# Patient Record
Sex: Female | Born: 1995 | Hispanic: Yes | Marital: Single | Smoking: Former smoker
Health system: Southern US, Community
[De-identification: ages and names within clinical notes are randomized; demographics above are authoritative.]

## PROBLEM LIST (undated history)

## (undated) DIAGNOSIS — I491 Atrial premature depolarization: Secondary | ICD-10-CM

## (undated) DIAGNOSIS — K219 Gastro-esophageal reflux disease without esophagitis: Secondary | ICD-10-CM

## (undated) DIAGNOSIS — R519 Headache, unspecified: Secondary | ICD-10-CM

## (undated) DIAGNOSIS — R002 Palpitations: Secondary | ICD-10-CM

## (undated) DIAGNOSIS — D649 Anemia, unspecified: Secondary | ICD-10-CM

## (undated) DIAGNOSIS — F419 Anxiety disorder, unspecified: Secondary | ICD-10-CM

## (undated) DIAGNOSIS — R079 Chest pain, unspecified: Secondary | ICD-10-CM

## (undated) DIAGNOSIS — I071 Rheumatic tricuspid insufficiency: Secondary | ICD-10-CM

## (undated) DIAGNOSIS — R51 Headache: Secondary | ICD-10-CM

## (undated) DIAGNOSIS — Z9109 Other allergy status, other than to drugs and biological substances: Secondary | ICD-10-CM

## (undated) HISTORY — PX: WISDOM TOOTH EXTRACTION: SHX21

---

## 2016-11-07 NOTE — Discharge Instructions (Signed)
General Anesthesia, Adult, Care After These instructions provide you with information about caring for yourself after your procedure. Your health care provider may also give you more specific instructions. Your treatment has been planned according to current medical practices, but problems sometimes occur. Call your health care provider if you have any problems or questions after your procedure. What can I expect after the procedure? After the procedure, it is common to have:  Vomiting.  A sore throat.  Mental slowness.  It is common to feel:  Nauseous.  Cold or shivery.  Sleepy.  Tired.  Sore or achy, even in parts of your body where you did not have surgery.  Follow these instructions at home: For at least 24 hours after the procedure:  Do not: ? Participate in activities where you could fall or become injured. ? Drive. ? Use heavy machinery. ? Drink alcohol. ? Take sleeping pills or medicines that cause drowsiness. ? Make important decisions or sign legal documents. ? Take care of children on your own.  Rest. Eating and drinking  If you vomit, drink water, juice, or soup when you can drink without vomiting.  Drink enough fluid to keep your urine clear or pale yellow.  Make sure you have little or no nausea before eating solid foods.  Follow the diet recommended by your health care provider. General instructions  Have a responsible adult stay with you until you are awake and alert.  Return to your normal activities as told by your health care provider. Ask your health care provider what activities are safe for you.  Take over-the-counter and prescription medicines only as told by your health care provider.  If you smoke, do not smoke without supervision.  Keep all follow-up visits as told by your health care provider. This is important. Contact a health care provider if:  You continue to have nausea or vomiting at home, and medicines are not helpful.  You  cannot drink fluids or start eating again.  You cannot urinate after 8-12 hours.  You develop a skin rash.  You have fever.  You have increasing redness at the site of your procedure. Get help right away if:  You have difficulty breathing.  You have chest pain.  You have unexpected bleeding.  You feel that you are having a life-threatening or urgent problem. This information is not intended to replace advice given to you by your health care provider. Make sure you discuss any questions you have with your health care provider. Document Released: 09/01/2000 Document Revised: 10/29/2015 Document Reviewed: 05/10/2015 Elsevier Interactive Patient Education  2018 Elsevier Inc[employer]     T & A INSTRUCTION SHEET - [facility]  Bud Face, MD Cammy Copa, MD  P. SCOTT BENNETT Linus Salmons, MD  [address] [place], [place] [address] TEL. [phone] [address] [place] [phone]  INFORMATION SHEET FOR A TONSILLECTOMY AND ADENDOIDECTOMY  About Your Tonsils and Adenoids  The tonsils and adenoids are normal body tissues that are part of our immune system.  They normally help to protect [place] against diseases that may enter our mouth and nose.  However, sometimes the tonsils and/or adenoids become too large and obstruct our breathing, especially at night.    If either of these things happen it helps to remove the tonsils and adenoids in order to become healthier. The operation to remove the tonsils and adenoids is called a tonsillectomy and adenoidectomy.  The Location of Your Tonsils and Adenoids  The tonsils are located in the back of the throat on both side and sit in a cradle of muscles. The adenoids are located in the roof of the mouth, behind the nose, and closely associated with the opening of the Eustachian tube to the ear.  Surgery on Tonsils and Adenoids  A tonsillectomy and  adenoidectomy is a short operation which takes about thirty minutes.  This includes being put to sleep and being awakened.  Tonsillectomies and adenoidectomies are performed at [facility] and may require observation period in the recovery room prior to going home.  Following the Operation for a Tonsillectomy  A cautery machine is used to control bleeding.  Bleeding from a tonsillectomy and adenoidectomy is minimal and postoperatively the risk of bleeding is approximately four percent, although this rarely life threatening.    After your tonsillectomy and adenoidectomy post-op care at home:  1. Our patients are able to go home the same day.  You may be given prescriptions for pain medications and antibiotics, if indicated. 2. It is extremely important to remember that fluid intake is of utmost importance after a tonsillectomy.  The amount that you drink must be maintained in the postoperative period.  A good indication of whether a child is getting enough fluid is whether his/her urine output is constant.  As long as children are urinating or wetting their diaper every 6 - 8 hours this is usually enough fluid intake.   3. Although rare, this is a risk of some bleeding in the first ten days after surgery.  This is usually occurs between day five and nine postoperatively.  This risk of bleeding is approximately four percent.  If you or your child should have any bleeding you should remain calm and notify our office or go directly to the Emergency Room at [facility] where they will contact us. Our doctors are available seven days a week for notification.  We recommend sitting up quietly in a chair, place an ice pack on the front of the neck and spitting out the blood gently until we are able to contact you.  Adults should gargle gently with ice water and this may help stop the bleeding.  If the bleeding does not stop after a short time, i.e. 10 to 15 minutes, or seems to be  increasing again, please contact us or go to the hospital.   4. It is common for the pain to be worse at 5 - 7 days postoperatively.  This occurs because the scab is peeling off and the mucous membrane (skin of the throat) is growing back where the tonsils were.   5. It is common for a low-grade fever, less than 102, during the first week after a tonsillectomy and adenoidectomy.  It is usually due to not drinking enough liquids, and we suggest your use liquid Tylenol or the pain medicine with Tylenol prescribed in order to keep your temperature below 102.  Please follow the directions on the back of the bottle. 6. Do not take aspirin or any products that contain aspirin such as Bufferin, Anacin, Ecotrin, aspirin gum, Goodies, BC headache powders, etc., after a T&A because it can promote bleeding.  Please check with our office before administering any other medication that may been prescribed by other doctors during the two week post-operative period. 7. If you happen to look in the mirror or into your childs mouth you will see white/gray patches on  the back of the throat.  This is what a scab looks like in the mouth and is normal after having a T&A.  It will disappear once the tonsil area heals completely. However, it may cause a noticeable odor, and this too will disappear with time.     8. You or your child may experience ear pain after having a T&A.  This is called referred pain and comes from the throat, but it is felt in the ears.  Ear pain is quite common and expected.  It will usually go away after ten days.  There is usually nothing wrong with the ears, and it is primarily due to the healing area stimulating the nerve to the ear that runs along the side of the throat.  Use either the prescribed pain medicine or Tylenol as needed.  9. The throat tissues after a tonsillectomy are obviously sensitive.  Smoking around children who have had a tonsillectomy significantly increases the risk of bleeding.   DO NOT SMOKE!

## 2016-11-11 ENCOUNTER — Ambulatory Visit: Payer: BLUE CROSS/BLUE SHIELD | Admitting: Anesthesiology

## 2016-11-11 ENCOUNTER — Ambulatory Visit
Admission: RE | Admit: 2016-11-11 | Discharge: 2016-11-11 | Disposition: A | Discharge status: Home / Self Care | Payer: BLUE CROSS/BLUE SHIELD | Source: Ambulatory Visit | Attending: Otolaryngology | Admitting: Otolaryngology

## 2016-11-11 ENCOUNTER — Encounter
Admission: RE | Disposition: A | Discharge status: Home / Self Care | Payer: Self-pay | Source: Ambulatory Visit | Attending: Otolaryngology

## 2016-11-11 DIAGNOSIS — K219 Gastro-esophageal reflux disease without esophagitis: Secondary | ICD-10-CM | POA: Diagnosis not present

## 2016-11-11 DIAGNOSIS — J3501 Chronic tonsillitis: Secondary | ICD-10-CM | POA: Diagnosis present

## 2016-11-11 DIAGNOSIS — Z87891 Personal history of nicotine dependence: Secondary | ICD-10-CM | POA: Insufficient documentation

## 2016-11-11 DIAGNOSIS — F419 Anxiety disorder, unspecified: Secondary | ICD-10-CM | POA: Insufficient documentation

## 2016-11-11 HISTORY — DX: Headache: R51

## 2016-11-11 HISTORY — PX: TONSILLECTOMY: SHX5217

## 2016-11-11 HISTORY — DX: Palpitations: R00.2

## 2016-11-11 HISTORY — DX: Headache, unspecified: R51.9

## 2016-11-11 HISTORY — DX: Gastro-esophageal reflux disease without esophagitis: K21.9

## 2016-11-11 HISTORY — DX: Other allergy status, other than to drugs and biological substances: Z91.09

## 2016-11-11 HISTORY — DX: Chest pain, unspecified: R07.9

## 2016-11-11 SURGERY — TONSILLECTOMY
Anesthesia: General | Site: Throat | Wound class: Clean Contaminated

## 2016-11-11 MED ORDER — IBUPROFEN 100 MG/5ML PO SUSP: 600.0000 mg | Freq: Once | ORAL | Status: DC | PRN

## 2016-11-11 MED ORDER — HYDROCODONE-ACETAMINOPHEN 7.5-325 MG/15ML PO SOLN: ORAL | 0 refills | Status: DC

## 2016-11-11 MED ORDER — SUCCINYLCHOLINE CHLORIDE 20 MG/ML IJ SOLN
INTRAMUSCULAR | Status: DC | PRN
  Administered 2016-11-11: 80 mg via INTRAVENOUS

## 2016-11-11 MED ORDER — ONDANSETRON HCL 4 MG/2ML IJ SOLN
4.0000 mg | Freq: Once | INTRAMUSCULAR | Status: AC | PRN
  Administered 2016-11-11: 4 mg via INTRAVENOUS

## 2016-11-11 MED ORDER — OXYCODONE HCL 5 MG/5ML PO SOLN
5.0000 mg | Freq: Once | ORAL | Status: AC | PRN
  Administered 2016-11-11: 5 mg via ORAL

## 2016-11-11 MED ORDER — MIDAZOLAM HCL 5 MG/5ML IJ SOLN
INTRAMUSCULAR | Status: DC | PRN
  Administered 2016-11-11: 2 mg via INTRAVENOUS

## 2016-11-11 MED ORDER — DEXAMETHASONE SODIUM PHOSPHATE 4 MG/ML IJ SOLN
INTRAMUSCULAR | Status: DC | PRN
  Administered 2016-11-11: 10 mg via INTRAVENOUS

## 2016-11-11 MED ORDER — AMOXICILLIN 400 MG/5ML PO SUSR: ORAL | 0 refills | Status: DC

## 2016-11-11 MED ORDER — OXYCODONE HCL 5 MG PO TABS: 5.0000 mg | ORAL_TABLET | Freq: Once | ORAL | Status: AC | PRN

## 2016-11-11 MED ORDER — PROPOFOL 10 MG/ML IV BOLUS
INTRAVENOUS | Status: DC | PRN
Start: 2016-11-11 — End: 2016-11-11
  Administered 2016-11-11: 160 mg via INTRAVENOUS

## 2016-11-11 MED ORDER — ACETAMINOPHEN 10 MG/ML IV SOLN
1000.0000 mg | Freq: Once | INTRAVENOUS | Status: AC
  Administered 2016-11-11: 1000 mg via INTRAVENOUS

## 2016-11-11 MED ORDER — LIDOCAINE HCL (CARDIAC) 20 MG/ML IV SOLN
INTRAVENOUS | Status: DC | PRN
  Administered 2016-11-11: 50 mg via INTRAVENOUS

## 2016-11-11 MED ORDER — BUPIVACAINE HCL (PF) 0.25 % IJ SOLN
INTRAMUSCULAR | Status: DC | PRN
  Administered 2016-11-11: 4 mL

## 2016-11-11 MED ORDER — PREDNISOLONE SODIUM PHOSPHATE 15 MG/5ML PO SOLN: ORAL | 0 refills | Status: DC

## 2016-11-11 MED ORDER — FENTANYL CITRATE (PF) 100 MCG/2ML IJ SOLN
25.0000 ug | INTRAMUSCULAR | Status: DC | PRN
  Administered 2016-11-11 (×3): 25 ug via INTRAVENOUS
  Administered 2016-11-11: 100 ug via INTRAVENOUS

## 2016-11-11 MED ORDER — LACTATED RINGERS IV SOLN
INTRAVENOUS | Status: DC
  Administered 2016-11-11: 08:00:00 via INTRAVENOUS

## 2016-11-11 MED ORDER — GLYCOPYRROLATE 0.2 MG/ML IJ SOLN
INTRAMUSCULAR | Status: DC | PRN
  Administered 2016-11-11: .1 mg via INTRAVENOUS

## 2016-11-11 SURGICAL SUPPLY — 16 items
CANISTER SUCT 1200ML W/VALVE (MISCELLANEOUS) ×3 IMPLANT
CATH ROBINSON RED A/P 10FR (CATHETERS) ×3 IMPLANT
COAG SUCT 10F 3.5MM HAND CTRL (MISCELLANEOUS) ×3 IMPLANT
DECANTER SPIKE VIAL GLASS SM (MISCELLANEOUS) ×3 IMPLANT
ELECT CAUTERY BLADE TIP 2.5 (TIP) ×3
ELECTRODE CAUTERY BLDE TIP 2.5 (TIP) ×1 IMPLANT
GLOVE BIO SURGEON STRL SZ7.5 (GLOVE) ×3 IMPLANT
KIT ROOM TURNOVER OR (KITS) ×3 IMPLANT
NEEDLE HYPO 25GX1X1/2 BEV (NEEDLE) ×3 IMPLANT
NS IRRIG 500ML POUR BTL (IV SOLUTION) ×3 IMPLANT
PACK TONSIL/ADENOIDS (PACKS) ×3 IMPLANT
PAD GROUND ADULT SPLIT (MISCELLANEOUS) ×3 IMPLANT
PENCIL ELECTRO HAND CTR (MISCELLANEOUS) ×3 IMPLANT
SOL ANTI-FOG 6CC FOG-OUT (MISCELLANEOUS) ×1 IMPLANT
SOL FOG-OUT ANTI-FOG 6CC (MISCELLANEOUS) ×2
SYRINGE 10CC LL (SYRINGE) ×3 IMPLANT

## 2016-11-11 NOTE — Op Note (Signed)
11/11/2016  8:12 AM    Nolene EbbsJessica Volk  [id number]   Pre-Op Diagnosis:  chronic tonsillitis  Post-op Diagnosis: chronic tonsillitis  Procedure: Tonsillectomy  Surgeon:  Sandi MealyBennett, [place] Hudman S., MD  Anesthesia:  General endotracheal  EBL:  Less than 25 cc  Complications:  None  Findings: 3+ cryptic tonsils  Procedure: The patient was taken to the Operating Room and placed in the supine position.  After induction of general endotracheal anesthesia, the table was turned 90 degrees and the patient was draped in the usual fashion  with the eyes protected.  A mouth gag was inserted into the oral cavity to open the mouth, and examination of the oropharynx showed the uvula was non-bifid. The palate was palpated, and there was no evidence of submucous cleft. Examination of the nasopharynx showed no obstructing adenoids. The right tonsil was grasped with an Allis clamp and resected from the tonsillar fossa in the usual fashion with the Bovie. The left tonsil was resected in the same fashion. The Bovie was used to obtain hemostasis. Each tonsillar fossa was then carefully injected with 0.25% marcaine, avoiding intravascular injection. The nose and throat were irrigated and suctioned to remove any  blood clot. The mouth gag was  removed with no evidence of active bleeding.  The patient was then returned to the anesthesiologist for awakening, and was taken to the Recovery Room in stable condition.  Cultures:  None.  Specimens:  Tonsils.  Disposition:   PACU to home  Plan: Soft, bland diet and push fluids. Take pain medications and antibiotics as prescribed. No strenuous activity for 2 weeks. Follow-up in 3 weeks.  Sandi MealyBennett, Kealan Buchan S 11/11/2016 8:12 AM

## 2016-11-11 NOTE — Anesthesia Postprocedure Evaluation (Signed)
Anesthesia Post Note  Patient: Tracie Harvey  Procedure(s) Performed: Procedure(s) (LRB): TONSILLECTOMY (N/A)  Patient location during evaluation: PACU Anesthesia Type: General Level of consciousness: awake and alert Pain management: pain level controlled Vital Signs Assessment: post-procedure vital signs reviewed and stable Respiratory status: spontaneous breathing, nonlabored ventilation, respiratory function stable and patient connected to nasal cannula oxygen Cardiovascular status: blood pressure returned to baseline and stable Postop Assessment: no signs of nausea or vomiting Anesthetic complications: no    Scarlette Sliceachel B Kalup Jaquith

## 2016-11-11 NOTE — H&P (Signed)
History and physical reviewed and will be scanned in later. No change in medical status reported by the patient or family, appears stable for surgery. All questions regarding the procedure answered, and patient (or family if a child) expressed understanding of the procedure.  Tracie Harvey S @TODAY@ 

## 2016-11-11 NOTE — Transfer of Care (Signed)
Immediate Anesthesia Transfer of Care Note  Patient: Tracie Harvey  Procedure(s) Performed: Procedure(s): TONSILLECTOMY (N/A)  Patient Location: PACU  Anesthesia Type: General  Level of Consciousness: awake, alert  and patient cooperative  Airway and Oxygen Therapy: Patient Spontanous Breathing and Patient connected to supplemental oxygen  Post-op Assessment: Post-op Vital signs reviewed, Patient's Cardiovascular Status Stable, Respiratory Function Stable, Patent Airway and No signs of Nausea or vomiting  Post-op Vital Signs: Reviewed and stable  Complications: No apparent anesthesia complications

## 2016-11-11 NOTE — Anesthesia Procedure Notes (Signed)
Procedure Name: Intubation Date/Time: 11/11/2016 7:48 AM Performed by: Tracie PicketAMYOT, Tracie Harvey Pre-anesthesia Checklist: Patient identified, Emergency Drugs available, Suction available, Patient being monitored and Timeout performed Patient Re-evaluated:Patient Re-evaluated prior to inductionOxygen Delivery Method: Circle system utilized Preoxygenation: Pre-oxygenation with 100% oxygen Intubation Type: IV induction Ventilation: Mask ventilation without difficulty Laryngoscope Size: Miller and 2 Grade View: Grade I Tube type: Oral Rae Tube size: 7.0 mm Number of attempts: 1 Placement Confirmation: ETT inserted through vocal cords under direct vision,  positive ETCO2 and breath sounds checked- equal and bilateral Tube secured with: Tape Dental Injury: Teeth and Oropharynx as per pre-operative assessment

## 2016-11-11 NOTE — Anesthesia Preprocedure Evaluation (Signed)
Anesthesia Evaluation  Patient identified by MRN, date of birth, ID band Patient awake    Reviewed: Allergy & Precautions, H&P , NPO status , Patient's Chart, lab work & pertinent test results, reviewed documented beta blocker date and time   Airway Mallampati: II  TM Distance: >3 FB Neck ROM: full    Dental no notable dental hx.    Pulmonary former smoker,    Pulmonary exam normal breath sounds clear to auscultation       Cardiovascular Exercise Tolerance: Good negative cardio ROS   Rhythm:regular Rate:Normal     Neuro/Psych  Headaches, PSYCHIATRIC DISORDERS (anxiety)    GI/Hepatic Neg liver ROS, GERD  ,  Endo/Other  negative endocrine ROS  Renal/GU negative Renal ROS  negative genitourinary   Musculoskeletal   Abdominal   Peds  Hematology negative hematology ROS (+)   Anesthesia Other Findings   Reproductive/Obstetrics negative OB ROS                             Anesthesia Physical Anesthesia Plan  ASA: II  Anesthesia Plan: General   Post-op Pain Management:    Induction:   PONV Risk Score and Plan:   Airway Management Planned:   Additional Equipment:   Intra-op Plan:   Post-operative Plan:   Informed Consent: I have reviewed the patients History and Physical, chart, labs and discussed the procedure including the risks, benefits and alternatives for the proposed anesthesia with the patient or authorized representative who has indicated his/her understanding and acceptance.   Dental Advisory Given  Plan Discussed with: CRNA  Anesthesia Plan Comments:         Anesthesia Quick Evaluation

## 2016-11-12 ENCOUNTER — Encounter: Payer: Self-pay | Admitting: Otolaryngology

## 2016-11-13 LAB — SURGICAL PATHOLOGY

## 2017-03-18 ENCOUNTER — Encounter: Payer: BLUE CROSS/BLUE SHIELD | Attending: Student | Admitting: Dietician

## 2017-03-18 ENCOUNTER — Encounter: Payer: Self-pay | Admitting: Dietician

## 2017-03-18 VITALS — Ht 63.0 in | Wt 168.5 lb

## 2017-03-18 DIAGNOSIS — R1084 Generalized abdominal pain: Secondary | ICD-10-CM | POA: Diagnosis not present

## 2017-03-18 NOTE — Patient Instructions (Addendum)
   Food to avoid to decrease stomach acid: Spicy foods, high fat foods, fried foods, acidic foods, coffee/ black tea, chocolate, processed meats  Eat foods high in calcium and fiber, and foods that contain probiotics (yogurt) to aid with stomach pains  Aim for 1700kcal / day for healthy weight loss. Would like to be able to get you to 1850kcal and still see weight loss... But we'll assess and go from there!   Remember, think color and variety when building meals  Continue to drink less sodas and instead choosing water / water + Miyo. Great job!  Work on Engineer, materials when eating out. For example: choosing chicken or shrimp tacos instead of steak, ordering veggies as a side, choosing grilled/baked meat instead of fried, asking for less cheese, etc.

## 2017-03-18 NOTE — Progress Notes (Signed)
Medical Nutrition Therapy: Visit start time: 1030  end time: 1130  Assessment:  Diagnosis: abdominal pain Past medical history: depression, eating disorder, anxiety, anemia, dysmenorrhea, see chart Psychosocial issues/ stress concerns: depression Preferred learning method:  . Auditory . Visual . Hands-on  Current weight: 168.5lb  Height:  Medications, supplements: Vitamin B12, Prenatal vitamin, Pantoprazole sodium, Dicyclomine, Metrogel, Fluconazole, see chart  Progress and evaluation: Patient reports wt gain and stomach pains both of which are of concern to her. Expresses needing assistance with how to plan and structure meals. Per her, testing revealed mild gastritis and a polyp and she is working with her doctor to find a medication that relieves her pain. States she struggles with her diet but likes to exercise. Describes a h/o an eating disorder which involved over-exercising and under-eating. Lowest wt 115#. Current goal wt 120-125#. Believes to be an emotional eater, as food is tied tightly to her culture. C/o not being able to stick to a healthier eating plan. Currently tracking calories x1 month in the MyFitnessPal app and has calories set at 1200.   Physical activity: Strength training or circuit-style training 3-5 days/wk for 30-33min each. Works out fasted   Dietary Intake:  Usual eating pattern includes 3 meals and 2 snacks per day. Dining out frequency: 6 meals per week.  Breakfast: eggs + veggies or sausage + fruit, cereal, oatmeal Snack: nuts and cheese Lunch: varies/ eats out the most. Sandwich with deli meat, [employer] (pork or steak tacos in a soft shell), [employer] chicken wings Snack: n/a Supper: boyfriend's mom cooks pasta, burgers & steaks + vegetables Snack: tomatoes and cucumbers with Tanjen seasoning Beverages: has cut down on sodas/ energy drinks, now drinks hot tea more than coffee, almond milk, water + Miyo.  Nutrition  Care Education: Topics covered: dietary interventions for reducing stomach pain/ acid, eating to fuel daily activity + exercise, how to make healthier choice when eating out, emotional eating, cravings and eating adequate calories, calculation of recommended daily calorie for healthy weight loss (1850kcal). Basic nutrition: basic food groups, appropriate nutrient balance, appropriate meal and snack schedule, general nutrition guidelines    Weight control: benefits of weight control, identifying healthy weight, determining reasonable weight goal, behavioral changes for weight loss Advanced nutrition: dining out, fiber/ probiotics and GI function Other lifestyle changes:  benefits of making changes, increasing motivation, readiness for change, identifying habits that need to change  Nutritional Diagnosis:  NI-1.5 Excessive energy intake As related to dietary habits.  As evidenced by BMI 30, patient report of being an emotional eater, h/o eating disorder (binge eating).  Intervention: Discussion as noted above. Patient was provided agreed upon goals, choosing 1-2 to work on until next visit on Friday November 9th. Dietary recommendations to help alleviate stomach pains were given. Personalized meal plan will be provided at next visit, based off of weight reduction goal.  Education Materials given: Marland [employer] Quick and Healthy Meals . Goals/ instructions  Learner/ who was taught:  . Patient   Level of understanding: Marland Kitchen Verbalizes/ demonstrates competency  Demonstrated degree of understanding via:   Teach back Learning barriers: . None  Willingness to learn/ readiness for change: . Eager, change in progress  Monitoring and Evaluation:  Dietary intake, exercise, stomach pains, and body weight      follow up: in 4 week(s)

## 2017-04-17 ENCOUNTER — Encounter: Payer: BLUE CROSS/BLUE SHIELD | Attending: Student | Admitting: Dietician

## 2017-04-17 ENCOUNTER — Encounter: Payer: Self-pay | Admitting: Dietician

## 2017-04-17 VITALS — Wt 169.0 lb

## 2017-04-17 DIAGNOSIS — R1084 Generalized abdominal pain: Secondary | ICD-10-CM | POA: Diagnosis not present

## 2017-04-17 NOTE — Progress Notes (Signed)
Medical Nutrition Therapy: Visit start time: 1000  end time: 1100  Assessment:  Diagnosis: abdominal pain Medical history changes: none Psychosocial issues/ stress concerns: none  Current weight: 169lb  Height: 5\' 3"  Medications, supplement changes: none  Progress and evaluation: Patient reports to have increased her calorie intake from 1200kcal/day to 1800kcal/day immediately following our first appointment. Continues to use MyFitnessPal app to track calorie intake. Wt has remained essentially the same; gain of 0.5# x4 wks. With increase in caloric intake she reports some days with increased energy and feeling more satisfied throughout the day. She also reports having more flexibility when eating out which she enjoys. However some days she feels that she must force-feed herself to meet her 1800kcal goal. Other interventions include eating more frequently: 3 meals and 2-3 snacks consistently. She is eating more vegetables, more protein and less carbohydrates. States she now eats 75% of meals at home and 25% of meals out. When eating out she feels she is making better choices. Has started to go to [employer] for lunch. At dinner time her boyfriend's mom is starting to prepare some healthier options and tries to accommodate to patient's requests, such as leaving toppings on the side. Continues to report having cravings for some carbohydrate-heavy foods.   Physical activity: Strength training or circuit-style training 3-5 days/wk for 30-45 min each  Dietary Intake:  Usual eating pattern includes 3 meals and 2-3 snacks per day. Dining out frequency: 3-4 meals per week.  Nutrition Care Education: Topics covered: *Fast FoodsWeight management guidelinesgeneral**, role of carbohydrates in the body, learning to be flexible with food choices, listening to the body's hunger and fullness cues, emotional eating Basic nutrition: basic food groups, appropriate nutrient balance, appropriate meal and snack  schedule, general nutrition guidelines  Weight control: behavioral changes for weight loss Advanced nutrition: dining out, food label reading Other lifestyle changes:  benefits of making changes, readiness for change, identifying habits that need to change  Nutritional Diagnosis:  [place]-3.3 Overweight/obesity As related to dietary habits.  As evidenced by BMI 29.  Intervention: Discussion as noted above. Patient will use meal planning/portion guidelines provided in order to learn how to plan healthy meals and how to include carbohydrates in the correct portions. She is considering fluctuating between 1700 and 1800kcal/ day depending on hunger and whether or not she does out to eat that day. Follow up in 4wks (May 15, 2017)  Education Materials given:  . 1800kcal / day meal plan recommendations . Goals/ instructions  Learner/ who was taught:  . Patient  Level of understanding: Marland Kitchen. Verbalizes/ demonstrates competency  Demonstrated degree of understanding via:   Teach back Learning barriers: . None  Willingness to learn/ readiness for change: . Eager, change in progress  Monitoring and Evaluation:  Dietary intake, exercise, and body weight      follow up: in 4 week(s) - December 7th

## 2017-05-15 ENCOUNTER — Encounter: Payer: BLUE CROSS/BLUE SHIELD | Attending: Student | Admitting: Dietician

## 2017-05-15 ENCOUNTER — Encounter: Payer: Self-pay | Admitting: Dietician

## 2017-05-15 VITALS — Wt 167.7 lb

## 2017-05-15 DIAGNOSIS — R1084 Generalized abdominal pain: Secondary | ICD-10-CM | POA: Diagnosis not present

## 2017-05-15 NOTE — Patient Instructions (Signed)
   Day 1: upper body day + cardio  Day 2: total body circuit day  Day 3: lower body day  Day 4: total body circuit day  Day 5: total body circuit day + cardio  Options for total body days: push, pull, legs, cardio and rotate through the circuit 3-5 times OR pick two exercising per body part and rotate through that circuit 3-4 times

## 2017-05-15 NOTE — Progress Notes (Signed)
Medical Nutrition Therapy: Visit start time: 1000  end time: 1030  Assessment:  Diagnosis: abdominal pain Medical history changes: none Psychosocial issues/ stress concerns: patient reports increased stress, trouble sleeping and seasonal depression  Current weight: 167.7lb  Height: 5\' 3"  Medications, supplement changes: none  Progress and evaluation: Weight loss of 2# since last visit. Patient reports scale wt to be 165# this morning. Continues to use MyFitnessPal app to track daily calories. Consuming between 1700-1750kcal/day typically with an untracked meal or two if going out to eat, particularly at her favorite [employer]. Feels less hungry and has had less cravings for carbohydrate-dense foods, as she has been honoring cravings when they do arise and being less restrictive with foods. Continues to eat the majority of meals at home and has started to eat more fish. States her mental and physical health have been up and down lately, making working out consistently more of a challenge. Her sleep quality has also decreased. C/o worsening stomach pains despite limiting acidic and spicy foods.   Physical activity: total-body circuit style training 3-5 days / week for 30min - 1hour each session  Dietary Intake:  Usual eating pattern includes 3 meals and 2-3 snacks per day. Dining out frequency: 2-4 meals per week.   Nutrition Care Education: Topics covered: *Food Diary Weight management guidelinesgeneral, benefits and recommended dosage of Vitamin D supplement, Melatonin supplement benefits, physical signs of stress (IE stomach pain) Basic nutrition: basic food groups, appropriate nutrient balance, appropriate meal and snack schedule, general nutrition guidelines    Weight control: behavioral changes for weight loss Advanced nutrition: dining out, hunger/fullness cues, managing and honoring food cravings Other lifestyle changes:  benefits of making changes, increasing motivation,  readiness for change, identifying habits that need to change  Nutritional Diagnosis:  [place]-3.3 Overweight/obesity As related to lifestyle habits and h/o disordered eating tendencies.  As evidenced by BMI 29.71 and patient report of h/o binge eating.  Intervention: Discussion as noted above. Patient will continue to honor her food cravings and to keep daily calories between 1700-1800kcal/day with a couple of "flexible" meals each week depending on hunger level. She will also trial new exercise routine in an attempt to increase her weekly adherence.  Education Materials given:  Marland Kitchen. Goals/ instructions  Learner/ who was taught:  . Patient   Level of understanding: Marland Kitchen. Verbalizes/ demonstrates competency  Demonstrated degree of understanding via:   Teach back Learning barriers: . None  Willingness to learn/ readiness for change: . Eager, change in progress  Monitoring and Evaluation:  Dietary intake, exercise, and body weight      follow up: in 4 week(s): January 4th, 2019.

## 2017-06-12 ENCOUNTER — Encounter: Payer: BLUE CROSS/BLUE SHIELD | Attending: Student | Admitting: Dietician

## 2017-06-12 ENCOUNTER — Encounter: Payer: Self-pay | Admitting: Dietician

## 2017-06-12 VITALS — Wt 174.0 lb

## 2017-06-12 DIAGNOSIS — R1084 Generalized abdominal pain: Secondary | ICD-10-CM

## 2017-06-12 DIAGNOSIS — E6609 Other obesity due to excess calories: Secondary | ICD-10-CM

## 2017-06-12 DIAGNOSIS — Z6830 Body mass index (BMI) 30.0-30.9, adult: Secondary | ICD-10-CM

## 2017-06-12 NOTE — Progress Notes (Signed)
Medical Nutrition Therapy: Visit start time: 1000  end time: 1100  Assessment:  Diagnosis: abdominal pain Medical history changes: none Psychosocial issues/ stress concerns: reports holidays to have been stressful  Current weight: 174.7lb  Height: 5\' 3"  Medications, supplement changes: started taking Vitamin D3 2000IU  Progress and evaluation: Post Holiday season, patient has gained 7# x4 weeks. Reports a change in her eating patterns, having more and different foods available and less control over daily foods consumed. Feels that she had less "self control" and more stress during this time, though did remain consistent with her workouts. She did not track her daily calories during the Holidays but did try to downsize portions of certain foods. Continues to desire goal wt of 120-125#. Reports stomach pains to be inconsistent but still prevalent. Her main concerns going forward are stress eating and what to choose for lunches at work/ planning meals ahead of time.   Physical activity: Total body circuits or upper/ lower body split; tried new workout structure discussed last session. Continues to work out 3-5 days/ week.  Dietary Intake:  Usual eating pattern includes 3 meals and 2-3 snacks per day. Dining out frequency: 2-4 meals per week.   Nutrition Care Education: Topics covered: *Lunch with PunchWeight management guidelinesgeneral, meal prep and easy lunch ideas, intuitive eating vs tracking calories, 80/20 rule of eating, stress eating Basic nutrition: basic food groups, appropriate nutrient balance, appropriate meal and snack schedule, general nutrition guidelines    Weight control: benefits of weight control, determining reasonable weight goal, behavioral changes for weight loss Advanced nutrition: recipe modification, cooking techniques, food label reading Other lifestyle changes:  benefits of making changes, readiness for change, identifying habits that need to change  Nutritional  Diagnosis:  [place]-3.3 Overweight/obesity As related to eating habits.  As evidenced by BMI 30.82. NI-1.7 Predicted excessive energy intake As related to change in foods available and eating habits during the Holiday season.  As evidenced by weight gain of 7# in 4 weeks.   Intervention: Discussion as noted above. 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 She is ready to get back on track towards her healthy lifestyle goals after the Holiday season.   Education Materials given:  . 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 Goals/ instructions  Learner/ who was taught:  . Patient  Level of understanding: Marland Kitchen. Verbalizes/ demonstrates competency  Demonstrated degree of understanding via:   Teach back Learning barriers: . None  Willingness to learn/ readiness for change: . Eager, change in progress  Monitoring and Evaluation:  Dietary intake, exercise, mindset around food, and body weight      follow up: in 5 week(s): February 8th, 2019

## 2017-06-15 ENCOUNTER — Other Ambulatory Visit: Payer: Self-pay | Admitting: Student

## 2017-06-15 DIAGNOSIS — R110 Nausea: Secondary | ICD-10-CM

## 2017-06-15 DIAGNOSIS — N390 Urinary tract infection, site not specified: Secondary | ICD-10-CM

## 2017-06-15 DIAGNOSIS — R1084 Generalized abdominal pain: Secondary | ICD-10-CM

## 2017-06-29 ENCOUNTER — Ambulatory Visit
Admission: RE | Admit: 2017-06-29 | Discharge: 2017-06-29 | Disposition: A | Discharge status: Home / Self Care | Payer: BLUE CROSS/BLUE SHIELD | Source: Ambulatory Visit | Attending: Student | Admitting: Student

## 2017-06-29 DIAGNOSIS — R1084 Generalized abdominal pain: Secondary | ICD-10-CM | POA: Diagnosis present

## 2017-06-29 DIAGNOSIS — N83201 Unspecified ovarian cyst, right side: Secondary | ICD-10-CM | POA: Diagnosis not present

## 2017-06-29 DIAGNOSIS — N390 Urinary tract infection, site not specified: Secondary | ICD-10-CM | POA: Insufficient documentation

## 2017-06-29 DIAGNOSIS — R110 Nausea: Secondary | ICD-10-CM | POA: Diagnosis not present

## 2017-06-29 MED ORDER — IOPAMIDOL (ISOVUE-300) INJECTION 61%
100.0000 mL | Freq: Once | INTRAVENOUS | Status: AC | PRN
  Administered 2017-06-29: 100 mL via INTRAVENOUS

## 2017-07-17 ENCOUNTER — Ambulatory Visit: Payer: BLUE CROSS/BLUE SHIELD | Admitting: Dietician

## 2017-07-31 ENCOUNTER — Ambulatory Visit: Payer: BLUE CROSS/BLUE SHIELD | Admitting: Dietician

## 2017-08-07 ENCOUNTER — Encounter: Payer: BLUE CROSS/BLUE SHIELD | Attending: Student | Admitting: Dietician

## 2017-08-07 ENCOUNTER — Encounter: Payer: Self-pay | Admitting: Dietician

## 2017-08-07 VITALS — Wt 168.6 lb

## 2017-08-07 DIAGNOSIS — R1084 Generalized abdominal pain: Secondary | ICD-10-CM | POA: Diagnosis not present

## 2017-08-07 DIAGNOSIS — Z008 Encounter for other general examination: Secondary | ICD-10-CM | POA: Insufficient documentation

## 2017-08-07 DIAGNOSIS — R194 Change in bowel habit: Secondary | ICD-10-CM | POA: Insufficient documentation

## 2017-08-07 DIAGNOSIS — Z862 Personal history of diseases of the blood and blood-forming organs and certain disorders involving the immune mechanism: Secondary | ICD-10-CM | POA: Insufficient documentation

## 2017-08-07 NOTE — Progress Notes (Signed)
Medical Nutrition Therapy: Visit start time: 0900  end time: 0930  Assessment:  Diagnosis: obesity Medical history changes: n/a Psychosocial issues/ stress concerns: feels stress from balancing school and jobs  Current weight: 168.6#   Height: 5\' 3"  Medications, supplement changes: stopped taking her birth control, started taking a probiotic temporarily d/t increased stomach pain  Progress and evaluation: Patient continues to follow 1700-1750kcal eating plan which she tracks in MyFitnessPal fairly consistently. She feels that she is making weekly meal prepping more of a habit and continues to eat and cook at home more than eating out. Tried making recipes in the slow cooker as suggested in last session with success. Made carnitas this week. She states that she has swapped many of her snack options for more nutrient-dense ones. When she feels stressed, rather than choosing empty calories to snack on such as chips she will now choose a yogurt or dark chocolate. Reports appetite / feelings of hunger and fullness to change daily based off of what foods she has chosen to eat. Describes feeling hungry an hour or two after eating cereal for breakfast, vs being kept full for several hours after eating eggs + fruit + yogurt. Due to worsening stomach pains with unknown cause, she has started to follow a gluten -free diet temporarily to see if her symptoms are alleviated.  Physical activity: Continues to do total-body circuit style training but is working out less days each week d/t back injury and recent illness. Got a FitBit and now aims to walk [id number] steps/day.  Dietary Intake:  Usual eating pattern includes 3 meals and 1-3 snacks per day. Dining out frequency: 1-3 meals per week.   Nutrition Care Education: Topics covered: *Lunch with PunchWeight management guidelinesgeneral, fiber and GI function; aim for 25g/day, meal prepping/ planning, recipe ideas, stress eating and mindful eating, foods that trigger  GERD symptoms, lower calorie/ higher "volume" foods, fiber protein healthy fats and satiety.  Basic nutrition: basic food groups, appropriate nutrient balance, appropriate meal and snack schedule, general nutrition guidelines    Weight control: behavioral changes for weight loss Advanced nutrition:  recipe modification, cooking techniques Other lifestyle changes:  benefits of making changes, identifying habits that need to change   Nutritional Diagnosis:  [place]-1.4 Altered GI function As related to unknown cause.  As evidenced by patient c/o recurrent stomach pain which keeps her awake at night.  Intervention: Discussion as noted above. Patient will try a few different interventions in order to try to alleviate stomach pains until she has an appointment her with gastroenterologist, such as Gluten free diet for approx. 3 weeks (has already been following for 1 week), eating an evening snack (no GERD triggering foods), and monitoring her fiber intake with a goal of 25g/day. She was also advised to increase her daily caloric intake by 50-100kcal should wt loss exceed 2# BW per week.  Education Materials given:  Marland Kitchen. Guidelines for mindful eating flow sheet . Goals/ instructions  Learner/ who was taught:  . Patient   Level of understanding: Marland Kitchen. Verbalizes/ demonstrates competency  Demonstrated degree of understanding via:   Teach back Learning barriers: . None  Willingness to learn/ readiness for change: . Eager, change in progress  Monitoring and Evaluation:  Dietary intake, exercise, and body weight      follow up: in 5 week(s): March 29th

## 2017-09-04 ENCOUNTER — Encounter: Payer: Self-pay | Admitting: Dietician

## 2017-09-04 VITALS — Wt 163.9 lb

## 2017-09-04 DIAGNOSIS — R1084 Generalized abdominal pain: Secondary | ICD-10-CM

## 2017-09-04 NOTE — Progress Notes (Signed)
Medical Nutrition Therapy: Visit start time: 0900  end time: 0930  Assessment:  Diagnosis: abdominal pain Medical history changes: none Psychosocial issues/ stress concerns: Starting a new job this upcoming Monday and is concerned about how her new work and workout schedule will be. Feels increased stress d/t this.  Current weight: 163.9#  Height: 5\' 3"  Medications, supplement changes: None  Progress and evaluation: Patient continues to make positive changes towards living a healthier lifestyle and continues to see weight reduction.  Wt loss of 4.7# x1 month. She has been meal-prepping consistently which has resulted in her going out to eat less often, now 2x/wk. When she goes out to eat she chooses more nutritious options such as sushi or tacos rather than quesadillas or enchiladas. For snacks, she has been opting for single-serve options for items like chips rather than buying a family-sized bag, has tried a few protein bars, and is choosing more fruit overall. Includes options like dark chocolate, candy or Jell-O occasionally but fits them into her daily caloric allotment. For worsening stomach pains, she tried implementing a snack before bed as discussed previously which helped sometimes but not consistently. Stopped following a gluten free diet after 2 weeks d/t no significant change in symptoms. Plans to ask to be tested for Celiac Disease at her next GI appointment. Does report increased caffeine consumption d/t feeling less energized and less sleep. She has also stopped taking her B-Vitamins recently which may also impact energy levels; plans to restart this week. Overall her intake of spicy and acidic food has decreased. Appetite lately has ranged from only eating 2 meals/day to eating every 2-3 hours throughout the day. She has been struggling to eat breakfast some days d/t lack of time. At home, her boyfriend's mother has been more understanding of patient's new eating patterns and has started to  cook with less butter/oil and saves packaging for patient so that she can log foods on MyFitness Pal easier.   Physical activity: Unchanged, see previous notes. Total-body style training 3-5 days/week before work. Continues to use FitBit to track daily steps.  Dietary Intake:  Usual eating pattern includes 3 meals and 1-3 snacks per day. Dining out frequency: 2 meals per week.   Nutrition Care Education: Topics covered: *Breakfast ideasWeight management guidelinesgeneral, stress / caffeine and stomach ulcers, fiber recommendations, workout guidelines, impacts of nutrition and sleep on stress and energy levels, B-vitamins and energy levels, mindful eating, pre-workout supplements Basic nutrition: basic food groups, appropriate nutrient balance, appropriate meal and snack schedule, general nutrition guidelines    Weight control: benefits of weight control, behavioral changes for weight loss  Nutritional Diagnosis:  [place]-3.3 Overweight/obesity As related to previous dietary habits.  As evidenced by BMI 29.03.  Intervention: Discussion as noted above. Patient will continue to implement current nutrition interventions as she is on track for her weight loss goal, such as meal-prepping, eating out less, tracking daily calories consumed, and choosing more wholesome or more controlled portions of snack items. She was instructed to add an extra snack or portion at a meal if feeling hungry d/t current rate of weight loss and report of feeling more hungry on certain days. She will also continue to monitor stomach pains vs. foods/caffeine consumed to identify triggers. She will try overnight oats and pre-made burritos as on-the-go breakfast options to avoid skipping this meal.  Education Materials given:  Marland Kitchen Goals/ instructions  Learner/ who was taught:  . Patient  Level of understanding: Marland Kitchen Verbalizes/ demonstrates competency  Demonstrated  degree of understanding via:   Teach back Learning  barriers: . None  Willingness to learn/ readiness for change: . Eager, change in progress  Monitoring and Evaluation:  Dietary intake, exercise, stomach pain, and body weight      follow up: in 6 week(s): May 10th, 2019

## 2017-09-04 NOTE — Patient Instructions (Signed)
   Daily fiber goal: 25g  Workout formats for total body days: 4-6 exercises for 2-4 sets each. Alternate between upper and lower body movements. Focus on "compound" movements such as squats, lunges, shoulder presses, lat pulldowns, pull ups, chest press, etc.  Weekly workout option #1:  1. Total body  2. off  3. Total body  4. off  5. Total body  Weekly workout option #2:  1. Total body  2. Total body  3. Upper body  4. Lower body  5. Total body  Weekly workout option #3:  1. Total body  2. Lower body  3. Off  4. Upper body  5. Total body

## 2017-10-16 ENCOUNTER — Ambulatory Visit: Payer: Self-pay | Admitting: Dietician

## 2017-10-16 ENCOUNTER — Telehealth: Payer: Self-pay | Admitting: Dietician

## 2017-10-16 NOTE — Telephone Encounter (Signed)
called regarting patient's missed appointment. she had called to reschedule but had the incorrect number. follow up appointment was made for next week

## 2017-10-23 ENCOUNTER — Encounter: Payer: BLUE CROSS/BLUE SHIELD | Attending: Student | Admitting: Dietician

## 2017-10-23 ENCOUNTER — Encounter: Payer: Self-pay | Admitting: Dietician

## 2017-10-23 VITALS — Wt 160.3 lb

## 2017-10-23 DIAGNOSIS — R1084 Generalized abdominal pain: Secondary | ICD-10-CM | POA: Diagnosis not present

## 2017-10-23 DIAGNOSIS — Z862 Personal history of diseases of the blood and blood-forming organs and certain disorders involving the immune mechanism: Secondary | ICD-10-CM | POA: Diagnosis not present

## 2017-10-23 DIAGNOSIS — Z008 Encounter for other general examination: Secondary | ICD-10-CM | POA: Diagnosis not present

## 2017-10-23 DIAGNOSIS — R194 Change in bowel habit: Secondary | ICD-10-CM | POA: Diagnosis not present

## 2017-10-23 NOTE — Progress Notes (Signed)
Medical Nutrition Therapy: Visit start time: 0830  end time: 0900  Assessment:  Diagnosis: Abdominal pain Medical history changes: none Psychosocial issues/ stress concerns: new job, increased stomach pain  Current weight: 160.3#  Height:  Medications, supplement changes: None  Progress and evaluation: Wt loss of 3.6# x7 weeks. She continues to meal prep and track daily calories consistently. Calorie goal remains at 1700, but does report actual calories consumed to vary anywhere from 1400-1850 depending on degree of stomach pain/ if she is feeling particularly hungry or stressed. Now that she has started a new job she finds preparing meals ahead of time to be easier d/t having weekends off. She has also started to incorporate snacks more regularly both in order to consume additional calories and because she eats a late lunch at work and finds herself hungry mid-morning. Now consumes breakfast regularly; purchased a Magic Bullet for smoothies and also enjoys making overnight oats. Has been paying closer attention to her fiber intake and reports to hit her daily goal of 25g fairly regularly. Increasing her fiber has made bowel movements more regular but has not alleviated stomach pains. She is no longer consuming coffee, hot sauce, or chocolate d/t increased abdominal pain. Her diet has been more bland/mild overall. C/o having to stop her regular physical activity schedule x1 month also r/t abdominal pain.   Physical activity: Has not gone to the gym x1 month d/t increased abdominal pain but has been trying to walk regularly. Considering trying yoga and/or pilates as other means of exercise.  Dietary Intake:  Usual eating pattern includes 3 meals and 1-2 snacks per day. Dining out frequency: 1 meals per week (Mexican on Friday nights)  Changes to daily diet: Breakfast: overnight oats, smoothies Snack: yogurt, trail mix, watermelon, rice cakes, angel food cake with strawberries  occasionally  Nutrition Care Education: Topics covered: *Breakfast ideasWeight management guidelinesgeneral, fiber, snack ideas and bringing snacks to work d/t frequency of clients bringing in sweets/muffins/doughnuts etc., ways to be physically active without increasing abdominal pain, calcium and magnesium and relieving stomach pain Basic nutrition: basic food groups, appropriate nutrient balance, appropriate meal and snack schedule, general nutrition guidelines    Weight control: behavioral changes for weight loss Advanced nutrition: cooking techniques, dining out, food label reading Other lifestyle changes: benefits of making changes, identifying habits that need to change  Nutritional Diagnosis:  [place]-1.4 Altered GI function As related to abdominal pain.  As evidenced by patient report of increased pain affecting ability to eat and exercise.  Intervention: Discussion as noted above. Patient will work to reduce daily stress and try to consume non-irritating, calcium and magnesium rich foods in order to help reduce her recurring stomach pain. She is considering trying walking, yoga and piliates as means of physical activity until she is able to return to her regular exercise routine. She will continue to meal prep, track calories, eat breakfast regularly, and incorporate more snacks between meals.   Education Materials given:  Marland Kitchen Goals/ instructions  Learner/ who was taught:  . Patient   Level of understanding: Marland Kitchen Verbalizes/ demonstrates competency  Demonstrated degree of understanding via:   Teach back Learning barriers: . None  Willingness to learn/ readiness for change: . Eager, change in progress  Monitoring and Evaluation:  Dietary intake, exercise, stomach pain, and body weight      follow up: in 6 week(s): December 04, 2017

## 2017-12-04 ENCOUNTER — Ambulatory Visit: Payer: Self-pay | Admitting: Dietician

## 2018-01-08 ENCOUNTER — Encounter: Payer: Self-pay | Admitting: Dietician

## 2018-01-08 ENCOUNTER — Encounter: Payer: BLUE CROSS/BLUE SHIELD | Attending: Student | Admitting: Dietician

## 2018-01-08 VITALS — Wt 157.4 lb

## 2018-01-08 DIAGNOSIS — Z862 Personal history of diseases of the blood and blood-forming organs and certain disorders involving the immune mechanism: Secondary | ICD-10-CM | POA: Insufficient documentation

## 2018-01-08 DIAGNOSIS — R1084 Generalized abdominal pain: Secondary | ICD-10-CM | POA: Diagnosis not present

## 2018-01-08 DIAGNOSIS — Z008 Encounter for other general examination: Secondary | ICD-10-CM | POA: Insufficient documentation

## 2018-01-08 DIAGNOSIS — R194 Change in bowel habit: Secondary | ICD-10-CM | POA: Insufficient documentation

## 2018-01-08 NOTE — Progress Notes (Signed)
Medical Nutrition Therapy: Visit start time: 0830  end time: 0900  Assessment:  Diagnosis: abdominal pain Medical history changes: none Psychosocial issues/ stress concerns: new job continues to generate stress per pt report  Current weight: 157.4#  Height: 5\' 3"  Medications, supplement changes: none  Progress and evaluation: CBW reflects total wt loss of approx. 11# since initial appointment 10 months ago. Goal wt for patient remains below 150#. Daily calorie goal remains at 1700kcal, eating less on some days -particularly on days when she is not physically active or is busy at work- and suspects that she eats more than this on the weekends. She continues to pre-prepare lunch and dinner meals for the week and is choosing nutritious snacks the majority of the time. Because she has continued to eat breakfast consistently she has noticed a drop in energy levels and tendency to overeat later in the day if she skips this meal. Stomach pain overall has improved, though adequate sleep remains an issue. At home she is sometimes tempted by snacks that other family members buy which do not reflect her current goals which is challenging for her, along with having no control over the foods prepared for dinner. Recently she has tried to make dinner her smallest meal of the day, as it is typically a more calorically dense meal.  Physical activity: Total-body style resistance training 3 days/week. 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 Dining out frequency: 1 meals per week ([employer])  Breakfast: overnight oatmeal or smoothie Snacks: trail mix, granola bar, rice cake rolls Supper: smallest meal of the day Beverages: water with Miyo or Crystal light, coffee occasionally  Nutrition Care Education: Topics covered: strategies to overcome weight loss plateaus, using nutrition tracking to pre-plan  desired foods while meeting daily calorie goal, decreasing acidic foods d/t stomach pain, flexible approach to eating, consistency, emphasis on incorporation of several lifestyle changes as "wins" rather than physical body weight change Basic nutrition: basic food groups, appropriate nutrient balance, appropriate meal and snack schedule, general nutrition guidelines    Weight control: benefits of weight control, identifying healthy weight, determining reasonable weight goal, behavioral changes for weight loss Advanced nutrition: cooking techniques, dining out Other lifestyle changes: benefits of making changes, identifying habits that need to change  Nutritional Diagnosis:  [place]-3.3 Overweight/obesity As related to lifestyle habits.  As evidenced by patient report of inconsistency with previously established nutrition goals on the weekends, likely leading to overeating and less physical activity weekly d/t change in job.  Intervention: Discussion as noted above. She will continue to be consistent with previously established goals IE eating breakfast, pre-preparing meals for the week, making healthy choices when eating out, choosing more nutrient-dense snacks the majority of the time. She may try the Calm supplement to help improve her sleep habits. Goals for next appointment are to make 1 change to exercise regimen and to evaluate food and beverage choices on weekends, where she tends to over-consume d/t less structure / planning. Will continue to emphasize a flexible approach to eating that allows her to eat the foods she wants without feeling deprived. To send request for new referral to referring provider in order to continue our sessions.  Education Materials given: Marland Kitchen Goals/ instructions  Learner/ who was taught:  . Patient  Level of understanding: Marland Kitchen Verbalizes/ demonstrates competency  Demonstrated degree of understanding via:   Teach  back Learning barriers: . None  Willingness to learn/  readiness for change: . Eager, change in progress  Monitoring and Evaluation:  Dietary intake, exercise, and body weight      follow up: in 2 month(s): March 05, 2018

## 2018-03-05 ENCOUNTER — Ambulatory Visit: Payer: BLUE CROSS/BLUE SHIELD | Admitting: Dietician

## 2018-04-09 ENCOUNTER — Ambulatory Visit: Payer: BLUE CROSS/BLUE SHIELD | Admitting: Dietician

## 2018-06-11 ENCOUNTER — Encounter: Payer: BLUE CROSS/BLUE SHIELD | Attending: Family Medicine | Admitting: Dietician

## 2018-06-11 ENCOUNTER — Encounter: Payer: Self-pay | Admitting: Dietician

## 2018-06-11 VITALS — Wt 160.7 lb

## 2018-06-11 DIAGNOSIS — R1084 Generalized abdominal pain: Secondary | ICD-10-CM | POA: Diagnosis not present

## 2018-06-11 DIAGNOSIS — E663 Overweight: Secondary | ICD-10-CM

## 2018-06-11 NOTE — Progress Notes (Signed)
Medical Nutrition Therapy: Visit start time: 1000  end time: 1045  Assessment:  Diagnosis: abdominal pain, overweight Medical history changes: None Psychosocial issues/ stress concerns: reports continued stress regarding job  Current weight: 160.7#  Height: 5\' 3"  Medications, supplement changes: No longer taking Diflucan, Hycet, Metrogel, Loestrin  Progress and evaluation: Pt reports to have moved into a new home in September, leaving her previous routine and gym behind. She has not been exercising regularly since then and, combined with her job stress, this has taken a toll on her sleep quality and has slightly increased frequency of stomach upset. She continues to track daily food intake on most days, eating more towards 1800kcal/day d/t increased snacking at work. Now that is is living on her own she is responsible for cooking dinner meals in addition to planning her breakfasts and lunches. She reports the dietary preferences of herself and her boyfriend to differ, sometimes making meal planning challenging. A lower budget for food has also been presenting a challenge and she has found herself not prioritizing sources of protein outside of her dinner meal. She has been buying frozen meals for lunches but tries to pick brands that have healthier meal options. She reports to have a new end goal wt of 145#.   Physical activity: not currently following a regular exercise routine and no longer has a gym membership but received a home gym system with a treadmill and weights that she plans to utilize for at-home workouts. Plans on starting next week 1-2 days/wk and progressing from there, both cardio and weight training  Dietary Intake:  Usual eating pattern includes 2-3 meals and 2-3 snacks per day. Dining out frequency: 0-1 meals per week.  Breakfast: Granola bar + Low sugar oatmeal packet, may skip this meal if she sleeps in before work Snack: yogurt, kettle cooked chips Lunch: [employer] or other  frozen meal + chips or fruit Snack: n/a Supper: soups & stews in crockpot, 'breakfast for dinner' Snack: dark chocolate, popcorn Beverages: water, coffee, some alcoholic beverages since turning 22, hot tea occasionally  Nutrition Care Education: Topics covered: grocery shopping on a budget, healthy snack options, sugar sweetened beverages and diet vs. Regular soda, artificial sweeteners, prioritizing protein at meal times especially breakfast, pre-workout food/beverage choices Basic nutrition: basic food groups, appropriate nutrient balance, appropriate meal and snack schedule, general nutrition guidelines    Weight control: determining reasonable weight goal, behavioral changes for weight loss Advanced nutrition: cooking techniques, dining out, food label reading Other lifestyle changes: benefits of making changes, increasing motivation, identifying habits that need to change, alcohol use  Nutritional Diagnosis:  NB-2.1 Physical inactivity As related to moving, work schedule, stomach pain.  As evidenced by pt report of stopping her regular exercise routine since September 2019.  Intervention: Discussion as noted above. Pt has set an initial goal to get back into a regular routine for physical activity. She will work out 1-2 days next week to start. She will continue to experiment with recipes for dinner meals now that she is responsible for cooking for herself and her boyfriend. Continue to eat breakfast regularly, eat out less, and pack lunches to bring to work most often. She will work on choosing more nutritious snack options and on adding a protein at breakfast to increase satiety and lessen frequency of snacking throughout the work day. She may try adding a protein powder into her oatmeal and eating or drinking something easily digestible to help with morning stomach pains which occasionally prevent her  from completing morning workouts.  Education Materials given:  Marland Kitchen Goals/  instructions  Learner/ who was taught:  . Patient   Level of understanding: Marland Kitchen Verbalizes/ demonstrates competency  Demonstrated degree of understanding via:   Teach back Learning barriers: . None  Willingness to learn/ readiness for change: . Eager, change in progress  Monitoring and Evaluation:  Dietary intake, exercise, and body weight      follow up: prn

## 2019-05-10 ENCOUNTER — Other Ambulatory Visit: Payer: Self-pay

## 2019-05-10 DIAGNOSIS — Z20822 Contact with and (suspected) exposure to covid-19: Secondary | ICD-10-CM

## 2019-05-12 LAB — NOVEL CORONAVIRUS, NAA: SARS-CoV-2, NAA: NOT DETECTED

## 2019-12-28 IMAGING — CT CT ABD-PELV W/ CM
2 of 4 series · 11 of 46 positions shown, 12 images · IV contrast (iopamidol)
Comparison: None.

CLINICAL DATA: Generalized abdominal pain with history of UTI.

EXAM:
CT ABDOMEN AND PELVIS WITH CONTRAST
TECHNIQUE: Multidetector CT imaging of the abdomen and pelvis was performed
using the standard protocol following bolus administration of
intravenous contrast.
CONTRAST:  100mL XMPGBN-JXX IOPAMIDOL (XMPGBN-JXX) INJECTION 61%

[Series 2: abd pelvis 5.00 br40 s3 ax · axial · 0.51mm/px · z∈[-1651,-1216]mm · 8 of 103 slices shown, 9 images]
[im 8/103  soft-tissue]
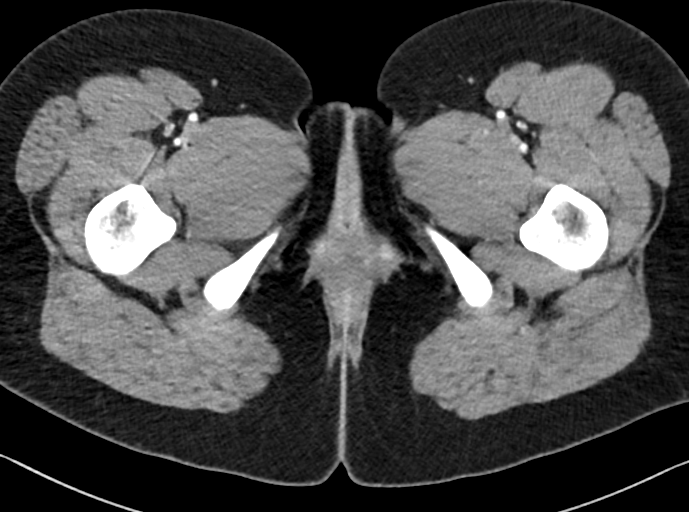
[im 8/103  bone]
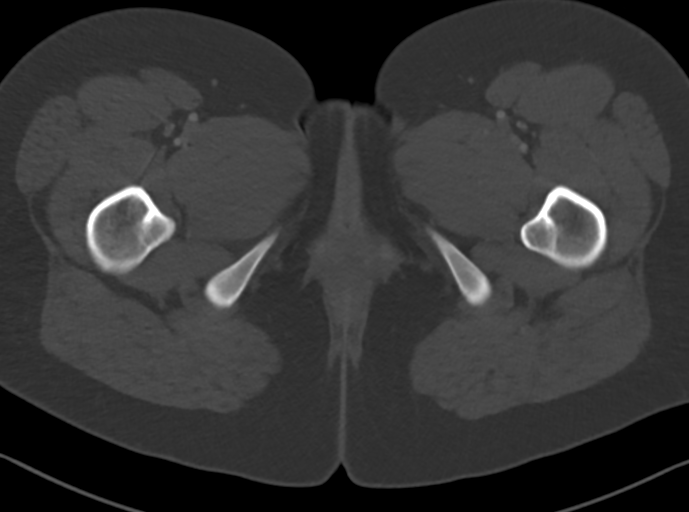
[im 20/103  soft-tissue]
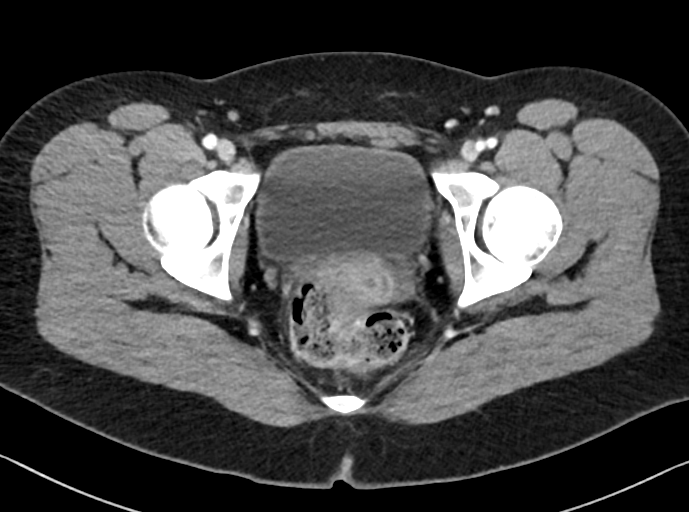
[im 32/103  soft-tissue]
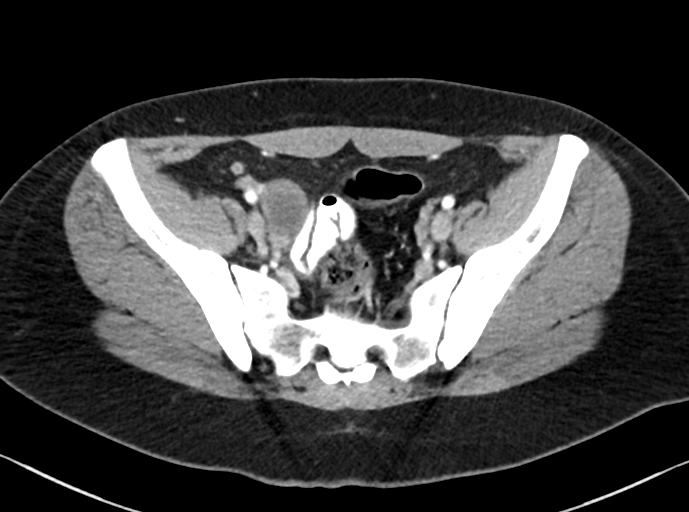
[im 44/103  soft-tissue]
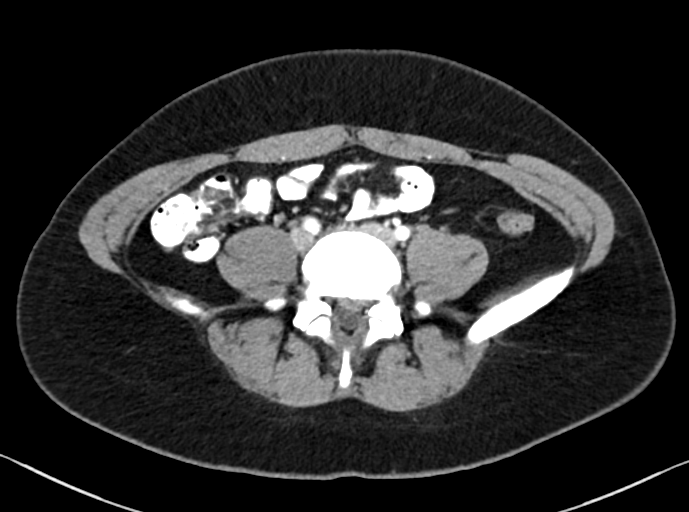
[im 59/103  soft-tissue]
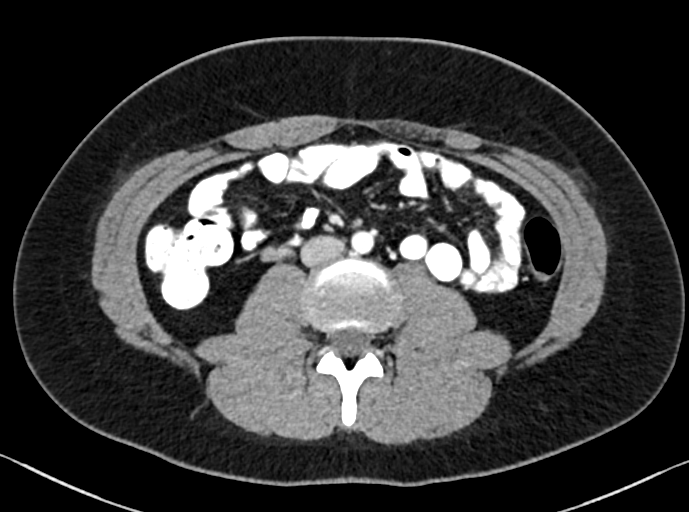
[im 71/103  soft-tissue]
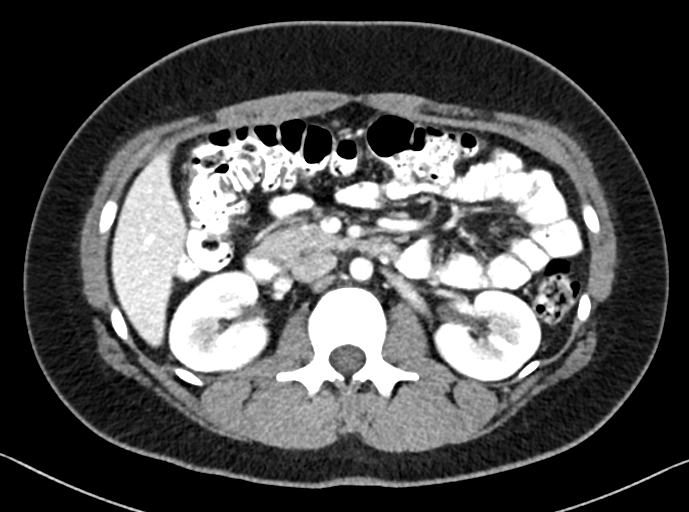
[im 83/103  soft-tissue]
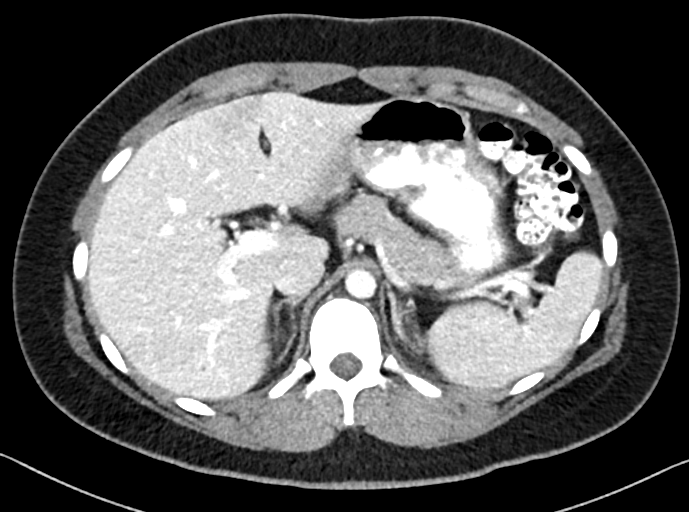
[im 95/103  soft-tissue]
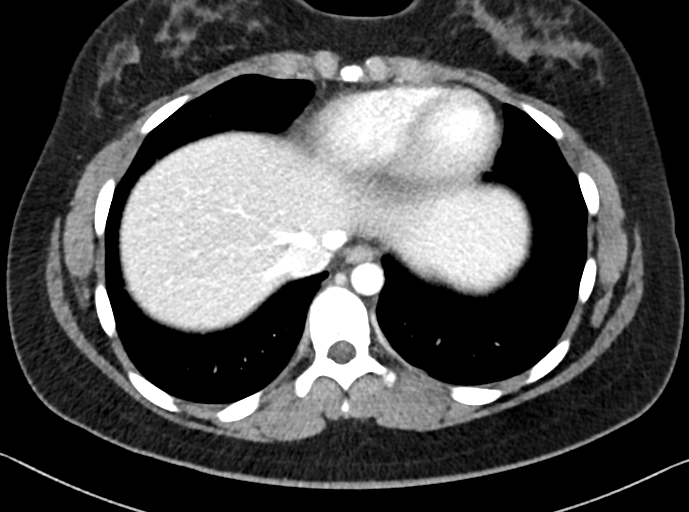

[Series 6: abd pelvis 2.00 br40 s3 cor · coronal · 0.66mm/px · 3 of 135 slices shown]
[im 45/135  soft-tissue]
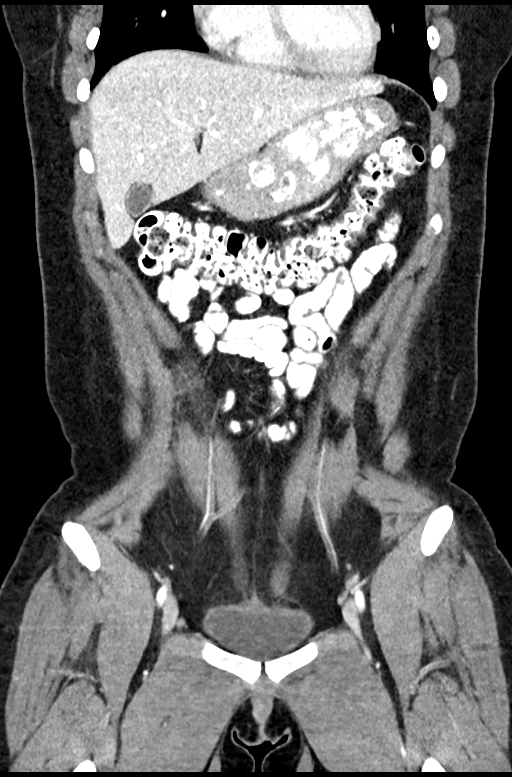
[im 60/135  soft-tissue]
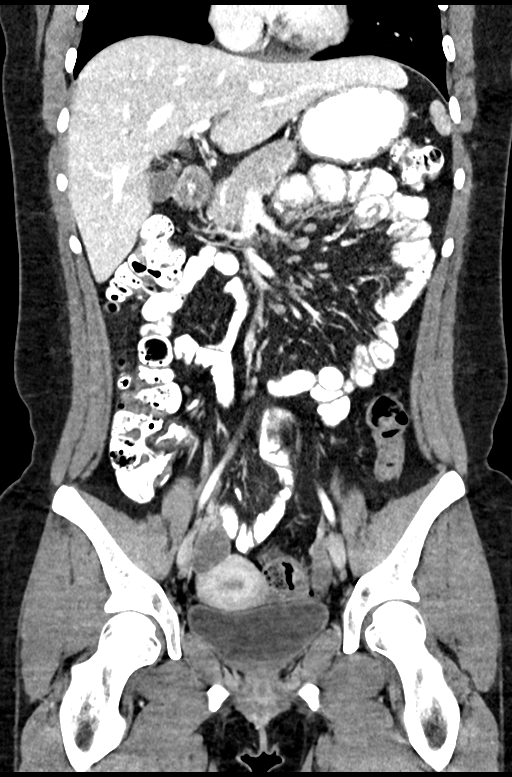
[im 75/135  soft-tissue]
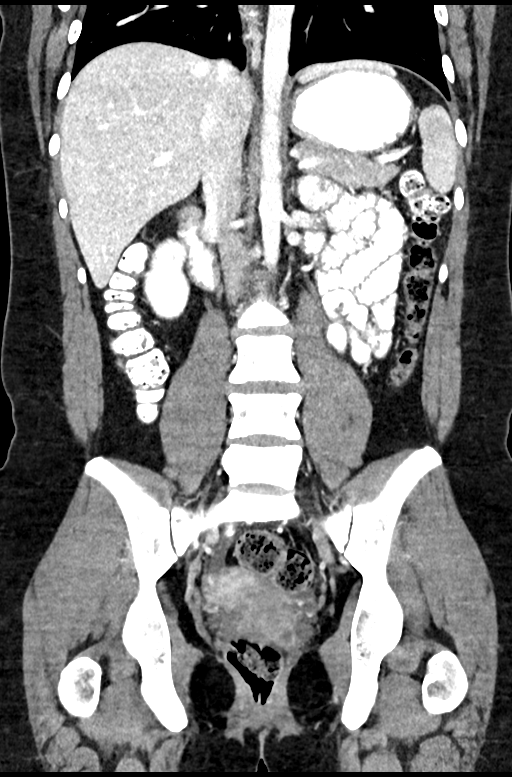

[11 of 46 positions shown; findings below may reference images not displayed]

FINDINGS: Lower chest: Unremarkable.

Hepatobiliary: Small area of low attenuation in the anterior liver,
adjacent to the falciform ligament, is in a characteristic location
for focal fatty deposition. There is no evidence for gallstones,
gallbladder wall thickening, or pericholecystic fluid. No
intrahepatic or extrahepatic biliary dilation.

Pancreas: No focal mass lesion. No dilatation of the main duct. No
intraparenchymal cyst. No peripancreatic edema.

Spleen: No splenomegaly. No focal mass lesion.

Adrenals/Urinary Tract: No adrenal nodule or mass. Kidneys are
unremarkable. No evidence for hydroureter. The urinary bladder
appears normal for the degree of distention.

Stomach/Bowel: Stomach is nondistended. No gastric wall thickening.
No evidence of outlet obstruction. Duodenum is normally positioned
as is the ligament of Treitz. No small bowel wall thickening. No
small bowel dilatation. The terminal ileum is normal. No gross
colonic mass. No colonic wall thickening. No substantial
diverticular change.

Vascular/Lymphatic: No abdominal aortic aneurysm. No abdominal
aortic atherosclerotic calcification. There is no gastrohepatic or
hepatoduodenal ligament lymphadenopathy. No intraperitoneal or
retroperitoneal lymphadenopathy. No pelvic sidewall lymphadenopathy.

Reproductive: The uterus has normal CT imaging appearance. 3.1 cm
benign appearing cyst identified in the right ovary. Left ovary
unremarkable.

Other: No intraperitoneal free fluid.

Musculoskeletal: Bone windows reveal no worrisome lytic or sclerotic
osseous lesions.
IMPRESSION: 1. No acute findings in the abdomen or pelvis. Specifically, no
findings to explain the patient's history of generalized abdominal
pain.
2. 3.1 cm benign appearing cyst in the right ovary. Based on lesion
size and patient age, no further imaging follow-up warranted. This
recommendation follows ACR consensus guidelines: White Paper of the
ACR Incidental Findings Committee II on Adnexal Findings. [HOSPITAL] [DATE].

## 2021-01-02 ENCOUNTER — Other Ambulatory Visit: Payer: Self-pay | Admitting: Obstetrics and Gynecology

## 2021-01-29 NOTE — H&P (Signed)
Tracie Harvey is a 25 y.o. female here for operative L/S , possible excision of endometriosis   For the past 1 yr she has had increasing sharp right pelvic pain . + dyspareunia  Deep thrust on the right .   Pain worse before cycle and first few days . 10 day / month of pain . Mirena in for 3 yrs and was working to decrease bleeding , but now pain and bleeding is worsening  Same sexual partner for the past few yrs .  No family h/o endometriosis   U/s   Uterus anteverted   Endometrium=4.21mm   Rt complex ovarian cyst=1.9cm Lt simple ovarian cyst=2.7cm Lt ovary volume=17.53ml Free fluid seen in PCDS   IUD seen in proper location at fundal end of endometrium         Past Medical History:  has a past medical history of Abnormal uterine bleeding, Anemia, Anxiety, Depression in pediatric patient, Dysmenorrhea, and Irritable bowel syndrome with diarrhea.  Past Surgical History:  has a past surgical history that includes Colonoscopy (01/06/2017); egd (01/06/2017); and tonsilectomy (11/11/2016). Family History: family history includes No Known Problems in her mother; Obesity in her father. Social History:  reports that she quit smoking about 6 years ago. She has a 1.50 pack-year smoking history. She has never used smokeless tobacco. She reports current alcohol use. She reports previous drug use. OB/GYN History:          OB History     Gravida  0   Para  0   Term  0   Preterm  0   AB  0   Living  0      SAB  0   IAB  0   Ectopic  0   Molar  0   Multiple  0   Live Births  0             Allergies: is allergic to metaxalone. Medications:   Current Outpatient Medications:    albuterol 90 mcg/actuation inhaler, Inhale 2 inhalations into the lungs every 4 (four) hours as needed for Wheezing, Disp: 1 each, Rfl: 2   aspirin/acetaminophen/caffeine (EXCEDRIN MIGRAINE ORAL), Take by mouth, Disp: , Rfl:    B infantis/B ani/B lon/B bifid (PROBIOTIC 4X ORAL), Take by mouth,  Disp: , Rfl:    buPROPion (WELLBUTRIN XL) 150 MG XL tablet, Take 1 tablet (150 mg total) by mouth once daily for 90 days, Disp: 90 tablet, Rfl: 0   cholecalciferol (VITAMIN D3) 1000 unit tablet, Take by mouth, Disp: , Rfl:    cyanocobalamin (VITAMIN B12) 1000 MCG tablet, Take 1,000 mcg by mouth once daily, Disp: , Rfl:    levonorgestreL (MIRENA 52 MG) 20 mcg/24 hr (6 years) IUD, Insert into the uterus, Disp: , Rfl:    loperamide (IMODIUM A-D) 2 mg tablet, Take 2 capsules after first loose stool, then one capsule after each following loose stool. Do not exceed 16 mg per day., Disp: 20 tablet, Rfl: 0   multivitamin tablet, Take 1 tablet by mouth once daily., Disp: , Rfl:    ondansetron (ZOFRAN-ODT) 4 MG disintegrating tablet, Take 1 tablet (4 mg total) by mouth every 8 (eight) hours as needed for Nausea or Vomiting, Disp: 20 tablet, Rfl: 0   ondansetron (ZOFRAN-ODT) 4 MG disintegrating tablet, Take 1 tablet (4 mg total) by mouth every 8 (eight) hours as needed for Nausea May take TWO at a time IF NEEDED., Disp: 20 tablet, Rfl: 0   sertraline (ZOLOFT) 50 MG  tablet, TAKE 1 AND 1/2 TABLETS(75 MG) BY MOUTH EVERY DAY, Disp: 135 tablet, Rfl: 0   dicyclomine (BENTYL) 10 mg capsule, Take 1 capsule (10 mg total) by mouth 3 (three) times daily as needed (abd pain, diarrhea) (Patient not taking: No sig reported), Disp: 60 capsule, Rfl: 2   Review of Systems: General:                      No fatigue or weight loss Eyes:                           No vision changes Ears:                            No hearing difficulty Respiratory:                No cough or shortness of breath Pulmonary:                  No asthma or shortness of breath Cardiovascular:           No chest pain, palpitations, dyspnea on exertion Gastrointestinal:          No abdominal bloating, chronic diarrhea, constipations, masses, pain or hematochezia Genitourinary:             No hematuria, dysuria, abnormal vaginal discharge, +pelvic pain,  Menometrorrhagia Lymphatic:                   No swollen lymph nodes Musculoskeletal:         No muscle weakness Neurologic:                  No extremity weakness, syncope, seizure disorder Psychiatric:                  No history of depression, delusions or suicidal/homicidal ideation      Exam:       Vitals:  01/30/21   BP: 96/69  Pulse: 76      Body mass index is 27.28 kg/m.   WDWN white/  female in NAD   Lungs: CTA  CV : RRR without murmur   Breast: exam done in sitting and lying position : No dimpling or retraction, no dominant mass, no spontaneous discharge, no axillary adenopathy Neck:  no thyromegaly Abdomen: soft , no mass, normal active bowel sounds,  non-tender, no rebound tenderness Pelvic: tanner stage 5 ,  External genitalia: vulva /labia no lesions Urethra: no prolapse Vagina: normal physiologic d/c Cervix: no lesions, no cervical motion tenderness   Uterus: normal size shape and contour, non-tender Adnexa: no mass,  non-tender   Rectovaginal: no mass heme negative   Impression:    The primary encounter diagnosis was Pelvic pain in female. A diagnosis of Cyst of ovary, right was also pertinent to this visit.   Pain and ovarian cyst is c/w endometrioisis   Plan:     operative L/S with excision of endometriosis and right endometrioma if found . I have explained the procedure to the patient .  Benefits and risks to surgery: The proposed benefit of the surgery has been discussed with the patient. The possible risks include, but are not limited to: organ injury to the bowel , bladder, ureters, and major blood vessels and nerves. There is a possibility of additional surgeries resulting from these injuries. There is also the risk of blood  transfusion and the need to receive blood products during or after the procedure which may rarely lead to HIV or Hepatitis C infection. There is a risk of developing a deep venous thrombosis or a pulmonary embolism . There is  the possibility of wound infection and also anesthetic complications, even the rare possibility of death. The patient understands these risks and wishes to proceed. All questions have been answered and the consent has been signed.           Vilma Prader, MD

## 2021-01-30 ENCOUNTER — Other Ambulatory Visit: Payer: BLUE CROSS/BLUE SHIELD

## 2021-02-15 ENCOUNTER — Other Ambulatory Visit: Payer: Self-pay

## 2021-02-15 ENCOUNTER — Encounter
Admission: RE | Admit: 2021-02-15 | Discharge: 2021-02-15 | Disposition: A | Discharge status: Home / Self Care | Payer: BC Managed Care – PPO | Source: Ambulatory Visit | Attending: Obstetrics and Gynecology | Admitting: Obstetrics and Gynecology

## 2021-02-15 HISTORY — DX: Atrial premature depolarization: I49.1

## 2021-02-15 HISTORY — DX: Anxiety disorder, unspecified: F41.9

## 2021-02-15 HISTORY — DX: Rheumatic tricuspid insufficiency: I07.1

## 2021-02-15 HISTORY — DX: Anemia, unspecified: D64.9

## 2021-02-15 NOTE — Patient Instructions (Signed)
Your procedure is scheduled on:02-22-21 Friday Report to the Registration Desk on the 1st floor of the [facility].Then proceed to the 2nd floor Surgery Desk in the Medical [facility] To find out your arrival time, please call [phone] between 1PM - 3PM on:02-21-21 Thursday  REMEMBER: Instructions that are not followed completely may result in serious medical risk, up to and including death; or upon the discretion of your surgeon and anesthesiologist your surgery may need to be rescheduled.  Do not eat food after midnight the night before surgery.  No gum chewing, lozengers or hard candies.  You may however, drink CLEAR liquids up to 2 hours before you are scheduled to arrive for your surgery. Do not drink anything within 2 hours of your scheduled arrival time.  Clear liquids include: - water  - apple juice without pulp - gatorade (not RED, PURPLE, OR BLUE) - black coffee or tea (Do NOT add milk or creamers to the coffee or tea) Do NOT drink anything that is not on this list  In addition, your doctor has ordered for you to drink the provided  Ensure Pre-Surgery Clear Carbohydrate Drink  Drinking this carbohydrate drink up to two hours before surgery helps to reduce insulin resistance and improve patient outcomes. Please complete drinking 2 hours prior to scheduled arrival time.  TAKE THESE MEDICATIONS THE MORNING OF SURGERY WITH A SIP OF WATER: -Wellbutrin (Bupropion)  One week prior to surgery: Stop Anti-inflammatories (NSAIDS) such as Advil, Aleve, Ibuprofen, Motrin, Naproxen, Naprosyn and Aspirin based products such as Excedrin, Goodys Powder, BC Powder.You may however, take Tylenol if needed for pain up until the day of surgery.  Stop ANY OVER THE COUNTER supplements/vitamins NOW (02-15-21) until after surgery.  No Alcohol for 24 hours before or after surgery.  No Smoking including e-cigarettes for 24 hours prior to surgery.  No chewable tobacco products for at least 6 hours prior  to surgery.  No nicotine patches on the day of surgery.  Do not use any "recreational" drugs for at least a week prior to your surgery.  Please be advised that the combination of cocaine and anesthesia may have negative outcomes, up to and including death. If you test positive for cocaine, your surgery will be cancelled.  On the morning of surgery brush your teeth with toothpaste and water, you may rinse your mouth with mouthwash if you wish. Do not swallow any toothpaste or mouthwash.  Do not wear jewelry, make-up, hairpins, clips or nail polish.  Do not wear lotions, powders, or perfumes.   Do not shave body from the neck down 48 hours prior to surgery just in case you cut yourself which could leave a site for infection.  Also, freshly shaved skin may become irritated if using the CHG soap.  Contact lenses, hearing aids and dentures may not be worn into surgery.  Do not bring valuables to the hospital. [facility] is not responsible for any missing/lost belongings or valuables.   Use CHG Soap as directed on instruction sheet  Notify your doctor if there is any change in your medical condition (cold, fever, infection).  Wear comfortable clothing (specific to your surgery type) to the hospital.  After surgery, you can help prevent lung complications by doing breathing exercises.  Take deep breaths and cough every 1-2 hours. Your doctor may order a device called an Incentive Spirometer to help you take deep breaths. When coughing or sneezing, hold a pillow firmly against your incision with both hands. This is  called "splinting." Doing this helps protect your incision. It also decreases belly discomfort.  If you are being admitted to the hospital overnight, leave your suitcase in the car. After surgery it may be brought to your room.  If you are being discharged the day of surgery, you will not be allowed to drive home. You will need a responsible adult (18 years or older) to drive  you home and stay with you that night.   If you are taking public transportation, you will need to have a responsible adult (18 years or older) with you. Please confirm with your physician that it is acceptable to use public transportation.   Please call the Pre-admissions Testing Dept. at [phone] if you have any questions about these instructions.  Surgery Visitation Policy:  Patients undergoing a surgery or procedure may have one family member or support person with them as long as that person is not COVID-19 positive or experiencing its symptoms.  That person may remain in the waiting area during the procedure.  Inpatient Visitation:    Visiting hours are 7 a.m. to 8 p.m. Inpatients will be allowed two visitors daily. The visitors may change each day during the patient's stay. No visitors under the age of 57. Any visitor under the age of 51 must be accompanied by an adult. The visitor must pass COVID-19 screenings, use hand sanitizer when entering and exiting the patient's room and wear a mask at all times, including in the patient's room. Patients must also wear a mask when staff or their visitor are in the room. Masking is required regardless of vaccination status.

## 2021-02-15 NOTE — Pre-Procedure Instructions (Signed)
During anesthesia preop phone call, when asked about chest pain, pt states she is having midsternal cp radiating up to middle of neck. It is a dull constant ache but denies sob. Pt states she has completely weaned herself off zoloft recently and she thinks this may be causing cp. I encouraged pt to call her pcp and inform her of these symptoms. Pt verbalized that she would

## 2021-02-19 ENCOUNTER — Other Ambulatory Visit: Payer: Self-pay

## 2021-02-19 ENCOUNTER — Encounter
Admission: RE | Admit: 2021-02-19 | Discharge: 2021-02-19 | Disposition: A | Discharge status: Home / Self Care | Payer: BC Managed Care – PPO | Source: Ambulatory Visit | Attending: Obstetrics and Gynecology | Admitting: Obstetrics and Gynecology

## 2021-02-19 DIAGNOSIS — Z01818 Encounter for other preprocedural examination: Secondary | ICD-10-CM | POA: Insufficient documentation

## 2021-02-19 DIAGNOSIS — N83292 Other ovarian cyst, left side: Secondary | ICD-10-CM | POA: Diagnosis not present

## 2021-02-19 DIAGNOSIS — Z79899 Other long term (current) drug therapy: Secondary | ICD-10-CM | POA: Diagnosis not present

## 2021-02-19 DIAGNOSIS — Z793 Long term (current) use of hormonal contraceptives: Secondary | ICD-10-CM | POA: Diagnosis not present

## 2021-02-19 DIAGNOSIS — G8929 Other chronic pain: Secondary | ICD-10-CM | POA: Diagnosis present

## 2021-02-19 DIAGNOSIS — Z888 Allergy status to other drugs, medicaments and biological substances status: Secondary | ICD-10-CM | POA: Diagnosis not present

## 2021-02-19 DIAGNOSIS — Z975 Presence of (intrauterine) contraceptive device: Secondary | ICD-10-CM | POA: Diagnosis not present

## 2021-02-19 DIAGNOSIS — N803 Endometriosis of pelvic peritoneum: Secondary | ICD-10-CM | POA: Diagnosis not present

## 2021-02-19 DIAGNOSIS — Z87891 Personal history of nicotine dependence: Secondary | ICD-10-CM | POA: Diagnosis not present

## 2021-02-19 DIAGNOSIS — N801 Endometriosis of ovary: Secondary | ICD-10-CM | POA: Diagnosis not present

## 2021-02-19 LAB — CBC
HCT: 40.4 % (ref 36.0–46.0)
Hemoglobin: 13.7 g/dL (ref 12.0–15.0)
MCH: 30.4 pg (ref 26.0–34.0)
MCHC: 33.9 g/dL (ref 30.0–36.0)
MCV: 89.6 fL (ref 80.0–100.0)
Platelets: 313 10*3/uL (ref 150–400)
RBC: 4.51 MIL/uL (ref 3.87–5.11)
RDW: 12.3 % (ref 11.5–15.5)
WBC: 7.7 10*3/uL (ref 4.0–10.5)
nRBC: 0 % (ref 0.0–0.2)

## 2021-02-19 LAB — TYPE AND SCREEN
ABO/RH(D): O POS
Antibody Screen: NEGATIVE

## 2021-02-19 LAB — BASIC METABOLIC PANEL
Anion gap: 7 (ref 5–15)
BUN: 13 mg/dL (ref 6–20)
CO2: 28 mmol/L (ref 22–32)
Calcium: 9.1 mg/dL (ref 8.9–10.3)
Chloride: 102 mmol/L (ref 98–111)
Creatinine, Ser: 0.69 mg/dL (ref 0.44–1.00)
GFR, Estimated: >60 mL/min (ref >60)
Glucose, Bld: 92 mg/dL (ref 70–99)
Potassium: 3.8 mmol/L (ref 3.5–5.1)
Sodium: 137 mmol/L (ref 135–145)

## 2021-02-22 ENCOUNTER — Encounter
Admission: RE | Disposition: A | Discharge status: Home / Self Care | Payer: Self-pay | Source: Home / Self Care | Attending: Obstetrics and Gynecology

## 2021-02-22 ENCOUNTER — Ambulatory Visit: Payer: BC Managed Care – PPO | Admitting: Certified Registered"

## 2021-02-22 ENCOUNTER — Ambulatory Visit: Payer: BC Managed Care – PPO | Admitting: Urgent Care

## 2021-02-22 ENCOUNTER — Encounter: Payer: Self-pay | Admitting: Obstetrics and Gynecology

## 2021-02-22 ENCOUNTER — Ambulatory Visit
Admission: RE | Admit: 2021-02-22 | Discharge: 2021-02-22 | Disposition: A | Discharge status: Home / Self Care | Payer: BC Managed Care – PPO | Attending: Obstetrics and Gynecology | Admitting: Obstetrics and Gynecology

## 2021-02-22 ENCOUNTER — Other Ambulatory Visit: Payer: Self-pay

## 2021-02-22 DIAGNOSIS — N83292 Other ovarian cyst, left side: Secondary | ICD-10-CM | POA: Insufficient documentation

## 2021-02-22 DIAGNOSIS — N801 Endometriosis of ovary: Secondary | ICD-10-CM | POA: Insufficient documentation

## 2021-02-22 DIAGNOSIS — Z87891 Personal history of nicotine dependence: Secondary | ICD-10-CM | POA: Insufficient documentation

## 2021-02-22 DIAGNOSIS — Z888 Allergy status to other drugs, medicaments and biological substances status: Secondary | ICD-10-CM | POA: Insufficient documentation

## 2021-02-22 DIAGNOSIS — Z79899 Other long term (current) drug therapy: Secondary | ICD-10-CM | POA: Insufficient documentation

## 2021-02-22 DIAGNOSIS — N803 Endometriosis of pelvic peritoneum: Secondary | ICD-10-CM | POA: Diagnosis not present

## 2021-02-22 DIAGNOSIS — Z975 Presence of (intrauterine) contraceptive device: Secondary | ICD-10-CM | POA: Insufficient documentation

## 2021-02-22 DIAGNOSIS — Z793 Long term (current) use of hormonal contraceptives: Secondary | ICD-10-CM | POA: Insufficient documentation

## 2021-02-22 HISTORY — PX: LAPAROSCOPY: SHX197

## 2021-02-22 LAB — URINE DRUG SCREEN, QUALITATIVE (ARMC ONLY)
Amphetamines, Ur Screen: NOT DETECTED
Barbiturates, Ur Screen: NOT DETECTED
Benzodiazepine, Ur Scrn: NOT DETECTED
Cannabinoid 50 Ng, Ur ~~LOC~~: POSITIVE — AB
Cocaine Metabolite,Ur ~~LOC~~: NOT DETECTED
MDMA (Ecstasy)Ur Screen: NOT DETECTED
Methadone Scn, Ur: NOT DETECTED
Opiate, Ur Screen: NOT DETECTED
Phencyclidine (PCP) Ur S: NOT DETECTED
Tricyclic, Ur Screen: NOT DETECTED

## 2021-02-22 LAB — ABO/RH: ABO/RH(D): O POS

## 2021-02-22 LAB — POCT PREGNANCY, URINE: Preg Test, Ur: NEGATIVE

## 2021-02-22 SURGERY — LAPAROSCOPY OPERATIVE
Anesthesia: General

## 2021-02-22 MED ORDER — FAMOTIDINE 20 MG PO TABS
ORAL_TABLET | ORAL | Status: AC
  Administered 2021-02-22: 20 mg via ORAL
  Filled 2021-02-22: qty 1

## 2021-02-22 MED ORDER — FENTANYL CITRATE (PF) 250 MCG/5ML IJ SOLN
INTRAMUSCULAR | Status: AC
  Filled 2021-02-22: qty 5

## 2021-02-22 MED ORDER — BUPIVACAINE HCL 0.5 % IJ SOLN
INTRAMUSCULAR | Status: DC | PRN
  Administered 2021-02-22: 12 mL

## 2021-02-22 MED ORDER — ONDANSETRON HCL 4 MG PO TABS: 8.0000 mg | ORAL_TABLET | Freq: Once | ORAL | Status: DC

## 2021-02-22 MED ORDER — LACTATED RINGERS IV SOLN: INTRAVENOUS | Status: DC

## 2021-02-22 MED ORDER — GABAPENTIN 300 MG PO CAPS: 300.0000 mg | ORAL_CAPSULE | ORAL | Status: AC

## 2021-02-22 MED ORDER — SUGAMMADEX SODIUM 200 MG/2ML IV SOLN
INTRAVENOUS | Status: DC | PRN
  Administered 2021-02-22: 200 mg via INTRAVENOUS

## 2021-02-22 MED ORDER — CHLORHEXIDINE GLUCONATE 0.12 % MT SOLN
OROMUCOSAL | Status: AC
  Administered 2021-02-22: 15 mL via OROMUCOSAL
  Filled 2021-02-22: qty 15

## 2021-02-22 MED ORDER — SILVER NITRATE-POT NITRATE 75-25 % EX MISC
CUTANEOUS | Status: DC | PRN
  Administered 2021-02-22: 1

## 2021-02-22 MED ORDER — LACTATED RINGERS IV SOLN
INTRAVENOUS | Status: DC
Start: 2021-02-22 — End: 2021-02-23

## 2021-02-22 MED ORDER — GLYCOPYRROLATE 0.2 MG/ML IJ SOLN
INTRAMUSCULAR | Status: DC | PRN
Start: 2021-02-22 — End: 2021-02-22
  Administered 2021-02-22: .2 mg via INTRAVENOUS

## 2021-02-22 MED ORDER — 0.9 % SODIUM CHLORIDE (POUR BTL) OPTIME
TOPICAL | Status: DC | PRN
  Administered 2021-02-22: 500 mL

## 2021-02-22 MED ORDER — MIDAZOLAM HCL 2 MG/2ML IJ SOLN
INTRAMUSCULAR | Status: AC
  Filled 2021-02-22: qty 2

## 2021-02-22 MED ORDER — MIDAZOLAM HCL 2 MG/2ML IJ SOLN
INTRAMUSCULAR | Status: DC | PRN
  Administered 2021-02-22: 2 mg via INTRAVENOUS

## 2021-02-22 MED ORDER — GABAPENTIN 300 MG PO CAPS
ORAL_CAPSULE | ORAL | Status: AC
  Administered 2021-02-22: 300 mg via ORAL
  Filled 2021-02-22: qty 1

## 2021-02-22 MED ORDER — FENTANYL CITRATE (PF) 100 MCG/2ML IJ SOLN
INTRAMUSCULAR | Status: DC | PRN
  Administered 2021-02-22 (×3): 50 ug via INTRAVENOUS

## 2021-02-22 MED ORDER — LIDOCAINE HCL (CARDIAC) PF 100 MG/5ML IV SOSY
PREFILLED_SYRINGE | INTRAVENOUS | Status: DC | PRN
Start: 2021-02-22 — End: 2021-02-22
  Administered 2021-02-22: 80 mg via INTRAVENOUS

## 2021-02-22 MED ORDER — FAMOTIDINE 20 MG PO TABS: 20.0000 mg | ORAL_TABLET | Freq: Once | ORAL | Status: AC

## 2021-02-22 MED ORDER — BUPIVACAINE HCL (PF) 0.5 % IJ SOLN
INTRAMUSCULAR | Status: AC
  Filled 2021-02-22: qty 30

## 2021-02-22 MED ORDER — PHENYLEPHRINE HCL (PRESSORS) 10 MG/ML IV SOLN
INTRAVENOUS | Status: DC | PRN
  Administered 2021-02-22 (×2): 200 ug via INTRAVENOUS
  Administered 2021-02-22: 100 ug via INTRAVENOUS

## 2021-02-22 MED ORDER — POVIDONE-IODINE 10 % EX SWAB
2.0000 "application " | Freq: Once | CUTANEOUS | Status: AC
  Administered 2021-02-22: 2 via TOPICAL

## 2021-02-22 MED ORDER — ACETAMINOPHEN 500 MG PO TABS
ORAL_TABLET | ORAL | Status: AC
  Administered 2021-02-22: 1000 mg via ORAL
  Filled 2021-02-22: qty 2

## 2021-02-22 MED ORDER — ACETAMINOPHEN 500 MG PO TABS: 1000.0000 mg | ORAL_TABLET | ORAL | Status: AC

## 2021-02-22 MED ORDER — ONDANSETRON HCL 4 MG/2ML IJ SOLN
INTRAMUSCULAR | Status: DC | PRN
  Administered 2021-02-22: 4 mg via INTRAVENOUS

## 2021-02-22 MED ORDER — PROPOFOL 10 MG/ML IV BOLUS
INTRAVENOUS | Status: AC
  Filled 2021-02-22: qty 20

## 2021-02-22 MED ORDER — DEXAMETHASONE SODIUM PHOSPHATE 10 MG/ML IJ SOLN
INTRAMUSCULAR | Status: DC | PRN
  Administered 2021-02-22: 10 mg via INTRAVENOUS

## 2021-02-22 MED ORDER — SILVER NITRATE-POT NITRATE 75-25 % EX MISC
CUTANEOUS | Status: AC
  Filled 2021-02-22: qty 10

## 2021-02-22 MED ORDER — KETOROLAC TROMETHAMINE 30 MG/ML IJ SOLN
INTRAMUSCULAR | Status: DC | PRN
  Administered 2021-02-22: 30 mg via INTRAVENOUS

## 2021-02-22 MED ORDER — ORAL CARE MOUTH RINSE: 15.0000 mL | Freq: Once | OROMUCOSAL | Status: AC

## 2021-02-22 MED ORDER — ROCURONIUM BROMIDE 100 MG/10ML IV SOLN
INTRAVENOUS | Status: DC | PRN
  Administered 2021-02-22: 50 mg via INTRAVENOUS
  Administered 2021-02-22: 10 mg via INTRAVENOUS

## 2021-02-22 MED ORDER — CHLORHEXIDINE GLUCONATE 0.12 % MT SOLN: 15.0000 mL | Freq: Once | OROMUCOSAL | Status: AC

## 2021-02-22 MED ORDER — OXYCODONE-ACETAMINOPHEN 5-325 MG PO TABS: 1.0000 | ORAL_TABLET | ORAL | Status: DC | PRN

## 2021-02-22 MED ORDER — PROPOFOL 10 MG/ML IV BOLUS
INTRAVENOUS | Status: DC | PRN
  Administered 2021-02-22: 180 mg via INTRAVENOUS

## 2021-02-22 SURGICAL SUPPLY — 46 items
ADH SKN CLS APL DERMABOND .7 (GAUZE/BANDAGES/DRESSINGS) ×1
ANCHOR TIS RET SYS 235ML (MISCELLANEOUS) IMPLANT
APL PRP STRL LF DISP 70% ISPRP (MISCELLANEOUS) ×1
BAG TISS RTRVL C235 10X14 (MISCELLANEOUS)
BLADE SURG SZ11 CARB STEEL (BLADE) ×2 IMPLANT
CATH FOLEY 2WAY  5CC 16FR (CATHETERS)
CATH FOLEY 2WAY 5CC 16FR (CATHETERS)
CATH ROBINSON RED A/P 16FR (CATHETERS) ×2 IMPLANT
CATH URTH 16FR FL 2W BLN LF (CATHETERS) IMPLANT
CHLORAPREP W/TINT 26 (MISCELLANEOUS) ×2 IMPLANT
DEFOGGER SCOPE WARMER CLEARIFY (MISCELLANEOUS) IMPLANT
DERMABOND ADVANCED (GAUZE/BANDAGES/DRESSINGS) ×1
DERMABOND ADVANCED .7 DNX12 (GAUZE/BANDAGES/DRESSINGS) ×1 IMPLANT
DRSG TEGADERM 2-3/8X2-3/4 SM (GAUZE/BANDAGES/DRESSINGS) ×6 IMPLANT
GAUZE 4X4 16PLY ~~LOC~~+RFID DBL (SPONGE) ×2 IMPLANT
GLOVE SURG SYN 8.0 (GLOVE) ×2 IMPLANT
GOWN STRL REUS W/ TWL LRG LVL3 (GOWN DISPOSABLE) ×1 IMPLANT
GOWN STRL REUS W/ TWL XL LVL3 (GOWN DISPOSABLE) ×1 IMPLANT
GOWN STRL REUS W/TWL LRG LVL3 (GOWN DISPOSABLE) ×2
GOWN STRL REUS W/TWL XL LVL3 (GOWN DISPOSABLE) ×2
GRASPER SUT TROCAR 14GX15 (MISCELLANEOUS) ×2 IMPLANT
IRRIGATION STRYKERFLOW (MISCELLANEOUS) IMPLANT
IRRIGATOR STRYKERFLOW (MISCELLANEOUS)
IV NS 1000ML (IV SOLUTION)
IV NS 1000ML BAXH (IV SOLUTION) IMPLANT
KIT TURNOVER CYSTO (KITS) ×2 IMPLANT
LABEL OR SOLS (LABEL) ×2 IMPLANT
MANIFOLD NEPTUNE II (INSTRUMENTS) ×2 IMPLANT
NS IRRIG 500ML POUR BTL (IV SOLUTION) ×2 IMPLANT
PACK GYN LAPAROSCOPIC (MISCELLANEOUS) ×2 IMPLANT
PAD OB MATERNITY 4.3X12.25 (PERSONAL CARE ITEMS) ×2 IMPLANT
PAD PREP 24X41 OB/GYN DISP (PERSONAL CARE ITEMS) ×2 IMPLANT
SCISSORS METZENBAUM CVD 33 (INSTRUMENTS) IMPLANT
SCRUB EXIDINE 4% CHG 4OZ (MISCELLANEOUS) IMPLANT
SET TUBE SMOKE EVAC HIGH FLOW (TUBING) ×2 IMPLANT
SHEARS HARMONIC ACE PLUS 36CM (ENDOMECHANICALS) ×2 IMPLANT
SLEEVE ENDOPATH XCEL 5M (ENDOMECHANICALS) ×4 IMPLANT
SPONGE GAUZE 2X2 8PLY STRL LF (GAUZE/BANDAGES/DRESSINGS) ×6 IMPLANT
SUT VIC AB 0 CT1 36 (SUTURE) ×2 IMPLANT
SUT VIC AB 2-0 UR6 27 (SUTURE) ×6 IMPLANT
SUT VIC AB 4-0 SH 27 (SUTURE) ×4
SUT VIC AB 4-0 SH 27XANBCTRL (SUTURE) ×2 IMPLANT
TROCAR ENDO BLADELESS 11MM (ENDOMECHANICALS) ×2 IMPLANT
TROCAR XCEL NON-BLD 5MMX100MML (ENDOMECHANICALS) ×2 IMPLANT
TROCAR XCEL UNIV SLVE 11M 100M (ENDOMECHANICALS) ×2 IMPLANT
WATER STERILE IRR 500ML POUR (IV SOLUTION) ×2 IMPLANT

## 2021-02-22 NOTE — Anesthesia Preprocedure Evaluation (Signed)
Anesthesia Evaluation  Patient identified by MRN, date of birth, ID band Patient awake    Reviewed: Allergy & Precautions, H&P , NPO status , Patient's Chart, lab work & pertinent test results, reviewed documented beta blocker date and time   Airway Mallampati: II  TM Distance: >3 FB Neck ROM: full    Dental  (+) Teeth Intact   Pulmonary neg pulmonary ROS, Patient abstained from smoking., former smoker,    Pulmonary exam normal        Cardiovascular Exercise Tolerance: Good negative cardio ROS Normal cardiovascular exam Rhythm:regular Rate:Normal     Neuro/Psych  Headaches, Anxiety negative psych ROS   GI/Hepatic Neg liver ROS, GERD  Medicated,  Endo/Other  negative endocrine ROS  Renal/GU negative Renal ROS  negative genitourinary   Musculoskeletal   Abdominal   Peds  Hematology  (+) Blood dyscrasia, anemia ,   Anesthesia Other Findings Past Medical History: No date: Anemia     Comment:  h/o No date: Anxiety No date: Chest pain     Comment:  patient states "due to anxiety" No date: Environmental allergies No date: GERD (gastroesophageal reflux disease)     Comment:  h/o No date: Headache     Comment:  migraines No date: Heart palpitations     Comment:  not seen by cardiologist No date: PAC (premature atrial contraction) No date: Tricuspid regurgitation     Comment:  mild Past Surgical History: 11/11/2016: TONSILLECTOMY; N/A     Comment:  Procedure: TONSILLECTOMY;  Surgeon: Geanie Logan, MD;                Location: [facility];  Service: ENT;                Laterality: N/A; No date: WISDOM TOOTH EXTRACTION BMI    Body Mass Index: 27.46 kg/m     Reproductive/Obstetrics negative OB ROS                             Anesthesia Physical Anesthesia Plan  ASA: 2  Anesthesia Plan: General ETT   Post-op Pain Management:    Induction:   PONV Risk Score and Plan:    Airway Management Planned:   Additional Equipment:   Intra-op Plan:   Post-operative Plan:   Informed Consent: I have reviewed the patients History and Physical, chart, labs and discussed the procedure including the risks, benefits and alternatives for the proposed anesthesia with the patient or authorized representative who has indicated his/her understanding and acceptance.     Dental Advisory Given  Plan Discussed with: CRNA  Anesthesia Plan Comments:         Anesthesia Quick Evaluation

## 2021-02-22 NOTE — Transfer of Care (Signed)
Immediate Anesthesia Transfer of Care Note  Patient: Tracie Harvey  Procedure(s) Performed: LAPAROSCOPY OPERATIVE, EXCISION OF ENDOMETRIOSIS  Patient Location: PACU  Anesthesia Type:General  Level of Consciousness: drowsy  Airway & Oxygen Therapy: Patient Spontanous Breathing and Patient connected to face mask oxygen  Post-op Assessment: Report given to RN and Post -op Vital signs reviewed and stable  Post vital signs: stable  Last Vitals:  Vitals Value Taken Time  BP 103/60 02/22/21 1740  Temp    Pulse 122 02/22/21 1741  Resp 12 02/22/21 1741  SpO2 100 % 02/22/21 1741  Vitals shown include unvalidated device data.  Last Pain:  Vitals:   02/22/21 1303  TempSrc: Tympanic  PainSc: 4       Patients Stated Pain Goal: 1 (02/22/21 1303)  Complications: No notable events documented.

## 2021-02-22 NOTE — Discharge Instructions (Signed)
AMBULATORY SURGERY  ?DISCHARGE INSTRUCTIONS ? ? ?The drugs that you were given will stay in your system until tomorrow so for the next 24 hours you should not: ? ?Drive an automobile ?Make any legal decisions ?Drink any alcoholic beverage ? ? ?You may resume regular meals tomorrow.  Today it is better to start with liquids and gradually work up to solid foods. ? ?You may eat anything you prefer, but it is better to start with liquids, then soup and crackers, and gradually work up to solid foods. ? ? ?Please notify your doctor immediately if you have any unusual bleeding, trouble breathing, redness and pain at the surgery site, drainage, fever, or pain not relieved by medication. ? ? ? ?Additional Instructions: ? ? ? ?Please contact your physician with any problems or Same Day Surgery at [phone], Monday through Friday 6 am to 4 pm, or [facility] number at [phone].  ?

## 2021-02-22 NOTE — Anesthesia Procedure Notes (Signed)
Procedure Name: Intubation Date/Time: 02/22/2021 4:38 PM Performed by: Jaye Beagle, CRNA Pre-anesthesia Checklist: Patient identified, Emergency Drugs available, Suction available and Patient being monitored Patient Re-evaluated:Patient Re-evaluated prior to induction Oxygen Delivery Method: Circle system utilized Preoxygenation: Pre-oxygenation with 100% oxygen Induction Type: IV induction Ventilation: Mask ventilation without difficulty Laryngoscope Size: McGraph and 3 Grade View: Grade I Tube type: Oral Tube size: 7.0 mm Number of attempts: 1 Airway Equipment and Method: Stylet and Oral airway Placement Confirmation: ETT inserted through vocal cords under direct vision, positive ETCO2 and breath sounds checked- equal and bilateral Secured at: 22 cm Tube secured with: Tape Dental Injury: Teeth and Oropharynx as per pre-operative assessment

## 2021-02-22 NOTE — Brief Op Note (Signed)
02/22/2021  5:32 PM  PATIENT:  Tracie Harvey  25 y.o. female  PRE-OPERATIVE DIAGNOSIS:  chronic pelvic pain  POST-OPERATIVE DIAGNOSIS:  chronic pelvic pain, endometriosis  PROCEDURE:  Procedure(s): LAPAROSCOPY OPERATIVE, EXCISION OF ENDOMETRIOSIS (N/A)  SURGEON:  Surgeon(s) and Role:    * Jiyaan Steinhauser, Ihor Austin, MD - Primary    * Christeen Douglas, MD  PHYSICIAN ASSISTANT:   ASSISTANTS: none   ANESTHESIA:   general  EBL:  2 mL IOF 1000 cc  BLOOD ADMINISTERED:none  DRAINS: none   LOCAL MEDICATIONS USED:  MARCAINE     SPECIMEN:  excision of endometriosis  DISPOSITION OF SPECIMEN:  PATHOLOGY  COUNTS:  YES  TOURNIQUET:  * No tourniquets in log *  DICTATION: .Other Dictation: Dictation Number verbal  PLAN OF CARE: Discharge to home after PACU  PATIENT DISPOSITION:  PACU - hemodynamically stable.   Delay start of Pharmacological VTE agent (>24hrs) due to surgical blood loss or risk of bleeding: not applicable

## 2021-02-23 ENCOUNTER — Encounter: Payer: Self-pay | Admitting: Obstetrics and Gynecology

## 2021-02-23 NOTE — Op Note (Signed)
Tracie Harvey, CARA MEDICAL RECORD NO: [id number] ACCOUNT NO: [id number] DATE OF BIRTH: [birth year] FACILITY: [facility] LOCATION: [facility] PHYSICIAN: Suzy Bouchard, MD  Operative Report   DATE OF PROCEDURE: 02/22/2021  PREOPERATIVE DIAGNOSIS:  Chronic pelvic pain.  POSTOPERATIVE DIAGNOSES:   1.  Chronic pelvic pain. 2.  Endometriosis.  SURGEON:  Suzy Bouchard, MD FIRST ASSISTANTDalbert Garnet, MD  ANESTHESIA:  General endotracheal anesthesia.  PROCEDURE:  Laparoscopic excision of endometriosis.  INDICATION:  A 25 year old patient with a 1-year history of increasing pelvic pain, more on the right and dyspareunia.  DESCRIPTION OF PROCEDURE:  After adequate general endotracheal anesthesia, the patient was placed in dorsal supine position with hip roll on the right dorsal supine position with the legs in the [place] stirrups.  The patient's abdomen, perineum and vagina  were prepped and draped in normal sterile fashion.  Timeout was performed.  Straight catheterization of the bladder yielded 100 mL clear urine.  Speculum was placed in the vagina and the anterior cervix was grasped with a single tooth tenaculum and a  Kahn cannula was placed in the endocervical canal to be used for uterine manipulation for the procedure.  Gloves and gown were changed.  Attention was directed to the patient's abdomen where a 5 mm infraumbilical incision was made.  Upon entry, the 5 mm  laparoscope into the abdominal cavity under direct visualization with the Optiview cannula.  A second port placement was placed left lower quadrant 3 cm medial to the left anterior iliac spine under direct visualization.  Trocar was placed.  Likewise, a  5 mm trocar was advanced in the right lower quadrant 3 cm medial to the right anterior iliac spine.  Initial impression revealed multiple small vesicular lesions throughout the pelvis. The right uterosacral ligament had a few stellate lesions that were grasped with  [place] graspers and the Harmonic scalpel was used to dissect this presumed  endometriosis.  A similar procedure was repeated on the patient's left ovarian fossa where a stellate lesion was identified and excised and posterior cul-de-sac lesion was dissected as well, 4 dissected areas in total.  Good hemostasis was noted.  The  appendix appeared normal.  Upper abdomen appeared normal.  The anterior abdominal pressure was lowered to 7 mmHg and good hemostasis was noted.  The patient's abdomen was deflated and all port sites were removed and each skin incision was closed with 4-0  Vicryl suture.  The single tooth tenaculum was removed from the vagina and silver nitrate was used at the tenaculum site. Good hemostasis noted.  There were no complications.  The patient did receive 30 mg Toradol intravenous at the end of the case.  INTRAOPERATIVE FLUIDS:  1000 mL  ESTIMATED BLOOD LOSS:  Minimal.  URINE OUTPUT:  100 mL   She was taken to recovery room in good condition.   PAA D: 02/22/2021 5:46:18 pm T: 02/23/2021 12:14:00 am  JOB: [id number]/ [id number]

## 2021-02-24 NOTE — Anesthesia Postprocedure Evaluation (Signed)
Anesthesia Post Note  Patient: Tracie Harvey  Procedure(s) Performed: LAPAROSCOPY OPERATIVE, EXCISION OF ENDOMETRIOSIS  Patient location during evaluation: PACU Anesthesia Type: General Level of consciousness: awake and alert Pain management: pain level controlled Vital Signs Assessment: post-procedure vital signs reviewed and stable Respiratory status: spontaneous breathing, nonlabored ventilation, respiratory function stable and patient connected to nasal cannula oxygen Cardiovascular status: blood pressure returned to baseline and stable Postop Assessment: no apparent nausea or vomiting Anesthetic complications: no   No notable events documented.   Last Vitals:  Vitals:   02/22/21 1835 02/22/21 1840  BP:  (!) 100/54  Pulse: 86 90  Resp: 17 12  Temp:  (!) 36.1 C  SpO2: 100% 100%    Last Pain:  Vitals:   02/22/21 1840  TempSrc:   PainSc: 0-No pain                 Lenard Simmer

## 2021-02-27 ENCOUNTER — Encounter: Payer: Self-pay | Admitting: *Deleted

## 2021-02-27 ENCOUNTER — Other Ambulatory Visit: Payer: Self-pay

## 2021-02-27 DIAGNOSIS — Z79899 Other long term (current) drug therapy: Secondary | ICD-10-CM | POA: Diagnosis not present

## 2021-02-27 DIAGNOSIS — G8918 Other acute postprocedural pain: Secondary | ICD-10-CM | POA: Diagnosis not present

## 2021-02-27 DIAGNOSIS — K219 Gastro-esophageal reflux disease without esophagitis: Secondary | ICD-10-CM | POA: Diagnosis not present

## 2021-02-27 DIAGNOSIS — R1031 Right lower quadrant pain: Secondary | ICD-10-CM | POA: Diagnosis present

## 2021-02-27 DIAGNOSIS — Z87891 Personal history of nicotine dependence: Secondary | ICD-10-CM | POA: Diagnosis not present

## 2021-02-27 DIAGNOSIS — J029 Acute pharyngitis, unspecified: Secondary | ICD-10-CM | POA: Insufficient documentation

## 2021-02-27 LAB — COMPREHENSIVE METABOLIC PANEL
ALT: 55 U/L — ABNORMAL HIGH (ref 0–44)
AST: 38 U/L (ref 15–41)
Albumin: 3.7 g/dL (ref 3.5–5.0)
Alkaline Phosphatase: 72 U/L (ref 38–126)
Anion gap: 7 (ref 5–15)
BUN: 12 mg/dL (ref 6–20)
CO2: 24 mmol/L (ref 22–32)
Calcium: 9.1 mg/dL (ref 8.9–10.3)
Chloride: 104 mmol/L (ref 98–111)
Creatinine, Ser: 0.57 mg/dL (ref 0.44–1.00)
GFR, Estimated: >60 mL/min (ref >60)
Glucose, Bld: 102 mg/dL — ABNORMAL HIGH (ref 70–99)
Potassium: 4 mmol/L (ref 3.5–5.1)
Sodium: 135 mmol/L (ref 135–145)
Total Bilirubin: 0.8 mg/dL (ref 0.3–1.2)
Total Protein: 7.1 g/dL (ref 6.5–8.1)

## 2021-02-27 LAB — CBC
HCT: 40 % (ref 36.0–46.0)
Hemoglobin: 13.7 g/dL (ref 12.0–15.0)
MCH: 30.9 pg (ref 26.0–34.0)
MCHC: 34.3 g/dL (ref 30.0–36.0)
MCV: 90.1 fL (ref 80.0–100.0)
Platelets: 320 10*3/uL (ref 150–400)
RBC: 4.44 MIL/uL (ref 3.87–5.11)
RDW: 12.2 % (ref 11.5–15.5)
WBC: 9.6 10*3/uL (ref 4.0–10.5)
nRBC: 0 % (ref 0.0–0.2)

## 2021-02-27 LAB — SURGICAL PATHOLOGY

## 2021-02-27 LAB — LIPASE, BLOOD: Lipase: 29 U/L (ref 11–51)

## 2021-02-27 NOTE — ED Triage Notes (Signed)
Pt to ED reporting generalized abd pain with nausea x 2 days. Pt has a laparoscopic procedure performed 5 days ago and reports a temp of 99.7 at home with pain around incisions. Upon assessment incisions appear to be healing appropriately, no redness, draining or opening of the wounds. Pt also has a dry cough and sore throat at this time.

## 2021-02-28 ENCOUNTER — Emergency Department: Payer: BC Managed Care – PPO

## 2021-02-28 ENCOUNTER — Emergency Department
Admission: EM | Admit: 2021-02-28 | Discharge: 2021-02-28 | Disposition: A | Discharge status: Home / Self Care | Payer: BC Managed Care – PPO | Attending: Emergency Medicine | Admitting: Emergency Medicine

## 2021-02-28 DIAGNOSIS — J029 Acute pharyngitis, unspecified: Secondary | ICD-10-CM

## 2021-02-28 DIAGNOSIS — G8918 Other acute postprocedural pain: Secondary | ICD-10-CM

## 2021-02-28 LAB — URINALYSIS, ROUTINE W REFLEX MICROSCOPIC
Bilirubin Urine: NEGATIVE
Glucose, UA: NEGATIVE mg/dL
Hgb urine dipstick: NEGATIVE
Ketones, ur: NEGATIVE mg/dL
Nitrite: NEGATIVE
Protein, ur: NEGATIVE mg/dL
Specific Gravity, Urine: 1.021 (ref 1.005–1.030)
pH: 6 (ref 5.0–8.0)

## 2021-02-28 LAB — POC URINE PREG, ED: Preg Test, Ur: NEGATIVE

## 2021-02-28 NOTE — ED Provider Notes (Signed)
[facility] Emergency Department Provider Note  ____________________________________________   Event Date/Time   First MD Initiated Contact with Patient 02/28/21 [phone]     (approximate)  I have reviewed the triage vital signs and the nursing notes.   HISTORY  Chief Complaint Abdominal Pain    HPI Tracie Harvey is a 25 y.o. female with medical history as listed below which includes some chronic recurrent abdominal/pelvic pain and anxiety.  She had a exploratory laparoscopic surgery by Dr. Feliberto Gottron about 5 days ago to evaluate for possible endometriosis.  She presents tonight for evaluation of pain in her right lower quadrant near the site of the incision.  She said that her intra-abdominal pain is about the same as it has been and no worse than usual, but she was worried about the appearance of one of the incisions from her laparoscopy.  She has never had surgery like that so she was not sure if which she was feeling or seeing was normal.  She is also had a little bit of a sore throat and a mild cough for couple days since the surgery, but her partner is also having some upper respiratory symptoms.  She reports no significant shortness of breath.  She denies fever, vomiting, and dysuria.  Some nausea.  Pain is mild and intermittent but it was worrisome to her because of the recent surgery.  Nothing in particular makes the symptoms better or worse.     Past Medical History:  Diagnosis Date   Anemia    h/o   Anxiety    Chest pain    patient states "due to anxiety"   Environmental allergies    GERD (gastroesophageal reflux disease)    h/o   Headache    migraines   Heart palpitations    not seen by cardiologist   PAC (premature atrial contraction)    Tricuspid regurgitation    mild    There are no problems to display for this patient.   Past Surgical History:  Procedure Laterality Date   LAPAROSCOPY N/A 02/22/2021   Procedure: LAPAROSCOPY  OPERATIVE, EXCISION OF ENDOMETRIOSIS;  Surgeon: Schermerhorn, Ihor Austin, MD;  Location: [facility];  Service: Gynecology;  Laterality: N/A;   TONSILLECTOMY N/A 11/11/2016   Procedure: TONSILLECTOMY;  Surgeon: Geanie Logan, MD;  Location: [facility];  Service: ENT;  Laterality: N/A;   WISDOM TOOTH EXTRACTION      Prior to Admission medications   Medication Sig Start Date End Date Taking? Authorizing Provider  buPROPion (WELLBUTRIN XL) 150 MG 24 hr tablet Take 150 mg by mouth every morning. 01/20/21   [provider]  gabapentin (NEURONTIN) 300 MG capsule Take 300 mg by mouth daily. Patient not taking: No sig reported 01/30/21   [provider]  HYDROcodone-acetaminophen (NORCO/VICODIN) 5-325 MG tablet Take 1-2 tablets by mouth every 4 (four) hours as needed for pain. Patient not taking: No sig reported 01/31/21   [provider]  hyoscyamine (LEVSIN SL) 0.125 MG SL tablet Place 0.125 mg under the tongue every 6 (six) hours as needed for cramping. 01/09/21   [provider]  ibuprofen (ADVIL) 800 MG tablet Take 800 mg by mouth 3 (three) times daily. Patient not taking: No sig reported 01/30/21   [provider]  levonorgestrel (MIRENA) 20 MCG/DAY IUD 1 each by Intrauterine route continuous.    [provider]  ondansetron (ZOFRAN) 8 MG tablet Take 8 mg by mouth 3 (three) times daily as needed for nausea/vomiting. Patient  not taking: Reported on 02/15/2021 01/30/21   [provider]  sertraline (ZOLOFT) 50 MG tablet Take 25 mg by mouth at bedtime. Patient not taking: Reported on 02/15/2021 12/26/20   [provider]    Allergies Metaxalone  History reviewed. No pertinent family history.  Social History Social History   Tobacco Use   Smoking status: Former    Packs/day: 0.25    Years: 3.00    Pack years: 0.75    Types: Cigarettes    Quit date: 02/15/2021    Years since quitting: 0.0   Smokeless tobacco: Never  Vaping  Use   Vaping Use: Never used  Substance Use Topics   Alcohol use: Yes    Comment: socially   Drug use: Not Currently    Types: Marijuana    Comment: as of 02-15-21 pt states she has not used marijuana in a few weeks    Review of Systems Constitutional: No fever/chills Eyes: No visual changes. ENT: Mild sore throat. Cardiovascular: Some mild chest discomfort associated with her cough. Respiratory: Denies shortness of breath. Gastrointestinal: Some chronic abdominal pain with some additional pain around her right lower quadrant incision from the laparoscopy. Genitourinary: Negative for dysuria. Musculoskeletal: Negative for neck pain.  Negative for back pain. Integumentary: Negative for rash. Neurological: Negative for headaches, focal weakness or numbness.   ____________________________________________   PHYSICAL EXAM:  VITAL SIGNS: ED Triage Vitals  Enc Vitals Group     BP 02/27/21 2201 113/73     Pulse Rate 02/27/21 2201 99     Resp 02/27/21 2201 18     Temp 02/27/21 2201 99 F (37.2 C)     Temp Source 02/28/21 0109 Oral     SpO2 02/27/21 2201 98 %     Weight --      Height 02/27/21 2201 1.6 m (5\' 3" )     Head Circumference --      Peak Flow --      Pain Score 02/27/21 2214 7     Pain Loc --      Pain Edu? --      Excl. in GC? --     Constitutional: Alert and oriented.  Well-appearing and in no distress. Eyes: Conjunctivae are normal.  Head: Atraumatic. Nose: No congestion/rhinnorhea. Mouth/Throat: No oropharyngeal erythema, petechiae, nor exudate.  No evidence of infection or swelling. Neck: No stridor.  No meningeal signs.   Cardiovascular: Normal rate, regular rhythm. Good peripheral circulation. Respiratory: Normal respiratory effort.  No retractions.  Occasional cough but breathing easily and comfortably with no wheezes nor coarse breath sounds. Gastrointestinal: Soft and nondistended.  No tenderness to palpation, no rebound and no guarding.  There is still  a bandage over the umbilical wound but there is no tenderness to palpation.  The wound in question on the right lower quadrant is well-appearing, healing with no evidence of infection, and there is no palpable fluctuance or fluid collection nor any surrounding cellulitis nor tenderness. Musculoskeletal: No lower extremity tenderness nor edema. No gross deformities of extremities. Neurologic:  Normal speech and language. No gross focal neurologic deficits are appreciated.  Skin:  Skin is warm, dry and intact. Psychiatric: Mood and affect are normal. Speech and behavior are normal.  ____________________________________________   LABS (all labs ordered are listed, but only abnormal results are displayed)  Labs Reviewed  COMPREHENSIVE METABOLIC PANEL - Abnormal; Notable for the following components:      Result Value   Glucose, Bld 102 (*)  ALT 55 (*)    All other components within normal limits  URINALYSIS, ROUTINE W REFLEX MICROSCOPIC - Abnormal; Notable for the following components:   Color, Urine YELLOW (*)    APPearance CLOUDY (*)    Leukocytes,Ua SMALL (*)    Bacteria, UA RARE (*)    All other components within normal limits  LIPASE, BLOOD  CBC  POC URINE PREG, ED   ____________________________________________   RADIOLOGY I, Loleta Rose, personally viewed and evaluated these images (plain radiographs) as part of my medical decision making, as well as reviewing the written report by the radiologist.  ED MD interpretation: No acute abnormalities on chest x-ray  Official radiology report(s): DG Chest 2 View  Result Date: 02/28/2021 CLINICAL DATA:  Shortness of breath. EXAM: CHEST - 2 VIEW COMPARISON:  None. FINDINGS: The heart size and mediastinal contours are within normal limits. Both lungs are clear. The visualized skeletal structures are unremarkable. IMPRESSION: No active cardiopulmonary disease. Electronically Signed   By: Elgie Collard M.D.   On: 02/28/2021 01:02     ____________________________________________   INITIAL IMPRESSION / MDM / ASSESSMENT AND PLAN / ED COURSE  As part of my medical decision making, I reviewed the following data within the electronic MEDICAL RECORD NUMBER Nursing notes reviewed and incorporated, Labs reviewed , Old chart reviewed, and Notes from prior ED visits   Differential diagnosis includes, but is not limited to, normal postoperative discomfort, intra-abdominal abscess or fluid collection, postoperative infection, COVID-19, post intubation pneumonia.  Patient is well-appearing and in no distress.  Normal vital signs.  Reassuring physical exam with no significant tenderness to palpation.  Metabolic panel and CBC are within normal limits.  There is some squamous epithelials and white cells with rare bacteria in her urine but she is having no urinary symptoms.  I will send a urine culture but I suspect this is contamination given the lack of other symptoms.  Based on her exam I do not think there is any evidence of an acute intra-abdominal infection or postoperative complication.  No indication for CT scan.  I provided reassurance and she is comfortable with the plan.    I personally reviewed the patient's imaging and agree with the radiologist's interpretation that there are no acute abnormalities on chest x-ray including pneumonia.  I believe that her mild sore throat and cough are either the result of a viral infection or normal postoperative discomfort.  I encouraged drinking plenty of fluids and taking ibuprofen and Tylenol.  She agrees with the plan and will follow up with Dr. Feliberto Gottron as regularly scheduled.  I gave my usual and customary return precautions.           ____________________________________________  FINAL CLINICAL IMPRESSION(S) / ED DIAGNOSES  Final diagnoses:  Sore throat  Post-operative pain     MEDICATIONS GIVEN DURING THIS VISIT:  Medications - No data to display   ED Discharge Orders      None        Note:  This document was prepared using Dragon voice recognition software and may include unintentional dictation errors.   Loleta Rose, MD 02/28/21 [phone]

## 2021-02-28 NOTE — Discharge Instructions (Addendum)
Your evaluation today was reassuring.  Your sore throat and mild cough may be the result of being intubated for your surgery, or you may have a mild viral infection, but either way you can drink plenty of fluids and take ibuprofen and Tylenol as needed.  Your wounds are well-appearing and your abdominal exam is reassuring with normal lab work today.  There is no indication that you have a postoperative complication.  Please continue to take any regular medications you were prescribed as well as ibuprofen and/or Tylenol according to label instructions and follow-up with Dr. Feliberto Gottron as scheduled.    Return to the emergency department if you develop new or worsening symptoms that concern you.

## 2022-10-10 ENCOUNTER — Ambulatory Visit (INDEPENDENT_AMBULATORY_CARE_PROVIDER_SITE_OTHER): Payer: BC Managed Care – PPO

## 2022-10-10 DIAGNOSIS — K5289 Other specified noninfective gastroenteritis and colitis: Secondary | ICD-10-CM | POA: Diagnosis not present

## 2022-10-10 DIAGNOSIS — Z09 Encounter for follow-up examination after completed treatment for conditions other than malignant neoplasm: Secondary | ICD-10-CM | POA: Diagnosis present

## 2022-10-10 DIAGNOSIS — Z86010 Personal history of colonic polyps: Secondary | ICD-10-CM | POA: Diagnosis not present
